# Patient Record
Sex: Male | Born: 1985 | Race: Black or African American | Hispanic: No | Marital: Single | Smoking: Former smoker
Health system: Southern US, Community
[De-identification: ages and names within clinical notes are randomized; demographics above are authoritative.]

## PROBLEM LIST (undated history)

## (undated) DIAGNOSIS — B20 Human immunodeficiency virus [HIV] disease: Secondary | ICD-10-CM

## (undated) DIAGNOSIS — K047 Periapical abscess without sinus: Secondary | ICD-10-CM

---

## 2012-01-04 ENCOUNTER — Encounter (HOSPITAL_COMMUNITY): Payer: Self-pay | Admitting: Emergency Medicine

## 2012-01-04 ENCOUNTER — Emergency Department (HOSPITAL_COMMUNITY)
Admission: EM | Admit: 2012-01-04 | Discharge: 2012-01-05 | Disposition: A | Discharge status: Home / Self Care | Payer: Self-pay | Attending: Emergency Medicine | Admitting: Emergency Medicine

## 2012-01-04 DIAGNOSIS — K0889 Other specified disorders of teeth and supporting structures: Secondary | ICD-10-CM

## 2012-01-04 DIAGNOSIS — Z87891 Personal history of nicotine dependence: Secondary | ICD-10-CM | POA: Insufficient documentation

## 2012-01-04 DIAGNOSIS — K029 Dental caries, unspecified: Secondary | ICD-10-CM

## 2012-01-04 DIAGNOSIS — K047 Periapical abscess without sinus: Secondary | ICD-10-CM

## 2012-01-04 MED ORDER — HYDROCODONE-ACETAMINOPHEN 5-325 MG PO TABS: 1.0000 | ORAL_TABLET | ORAL | Status: AC | PRN

## 2012-01-04 MED ORDER — PENICILLIN V POTASSIUM 500 MG PO TABS: 500.0000 mg | ORAL_TABLET | Freq: Three times a day (TID) | ORAL | Status: AC

## 2012-01-04 NOTE — Discharge Instructions (Signed)
You were seen and evaluated for your dental pains. It is strongly recommended that you followup with the dental specialist evaluation and treatment of your symptoms. You were given a prescription for an antibiotic to prevent infections. Please take this as instructed for the folate that time.   Dental Pain A tooth ache may be caused by cavities (tooth decay). Cavities expose the nerve of the tooth to air and hot or cold temperatures. It may come from an infection or abscess (also called a boil or furuncle) around your tooth. It is also often caused by dental caries (tooth decay). This causes the pain you are having. DIAGNOSIS  Your caregiver can diagnose this problem by exam. TREATMENT   If caused by an infection, it may be treated with medications which kill germs (antibiotics) and pain medications as prescribed by your caregiver. Take medications as directed.   Only take over-the-counter or prescription medicines for pain, discomfort, or fever as directed by your caregiver.   Whether the tooth ache today is caused by infection or dental disease, you should see your dentist as soon as possible for further care.  SEEK MEDICAL CARE IF: The exam and treatment you received today has been provided on an emergency basis only. This is not a substitute for complete medical or dental care. If your problem worsens or new problems (symptoms) appear, and you are unable to meet with your dentist, call or return to this location. SEEK IMMEDIATE MEDICAL CARE IF:   You have a fever.   You develop redness and swelling of your face, jaw, or neck.   You are unable to open your mouth.   You have severe pain uncontrolled by pain medicine.  MAKE SURE YOU:   Understand these instructions.   Will watch your condition.   Will get help right away if you are not doing well or get worse.  Document Released: 06/29/2005 Document Revised: 06/18/2011 Document Reviewed: 02/15/2008 [facility] Patient Information  2012 [place], [place].     Dental Care and Dentist Visits Dental care supports good overall health. Regular dental visits can also help you avoid dental pain, bleeding, infection, and other more serious health problems in the future. It is important to keep the mouth healthy because diseases in the teeth, gums, and other oral tissues can spread to other areas of the body. Some problems, such as diabetes, heart disease, and pre-term labor have been associated with poor oral health.  See your dentist every 6 months. If you experience emergency problems such as a toothache or broken tooth, go to the dentist right away. If you see your dentist regularly, you may catch problems early. It is easier to be treated for problems in the early stages.  WHAT TO EXPECT AT A DENTIST VISIT  Your dentist will look for many common oral health problems and recommend proper treatment. At your regular dental visit, you can expect:  Gentle cleaning of the teeth and gums. This includes scraping and polishing. This helps to remove the sticky substance around the teeth and gums (plaque). Plaque forms in the mouth shortly after eating. Over time, plaque hardens on the teeth as tartar. If tartar is not removed regularly, it can cause problems. Cleaning also helps remove stains.   Periodic X-rays. These pictures of the teeth and supporting bone will help your dentist assess the health of your teeth.   Periodic fluoride treatments. Fluoride is a natural mineral shown to help strengthen teeth. Fluoride treatmentinvolves applying a fluoride gel or varnish  to the teeth. It is most commonly done in children.   Examination of the mouth, tongue, jaws, teeth, and gums to look for any oral health problems, such as:   Cavities (dental caries). This is decay on the tooth caused by plaque, sugar, and acid in the mouth. It is best to catch a cavity when it is small.   Inflammation of the gums caused by plaque buildup (gingivitis).    Problems with the mouth or malformed or misaligned teeth.   Oral cancer or other diseases of the soft tissues or jaws.  KEEP YOUR TEETH AND GUMS HEALTHY For healthy teeth and gums, follow these general guidelines as well as your dentist's specific advice:  Have your teeth professionally cleaned at the dentist every 6 months.   Brush twice daily with a fluoride toothpaste.   Floss your teeth daily.   Ask your dentist if you need fluoride supplements, treatments, or fluoride toothpaste.   Eat a healthy diet. Reduce foods and drinks with added sugar.   Avoid smoking.  TREATMENT FOR ORAL HEALTH PROBLEMS If you have oral health problems, treatment varies depending on the conditions present in your teeth and gums.  Your caregiver will most likely recommend good oral hygiene at each visit.   For cavities, gingivitis, or other oral health disease, your caregiver will perform a procedure to treat the problem. This is typically done at a separate appointment. Sometimes your caregiver will refer you to another dental specialist for specific tooth problems or for surgery.  SEEK IMMEDIATE DENTAL CARE IF:  You have pain, bleeding, or soreness in the gum, tooth, jaw, or mouth area.   A permanent tooth becomes loose or separated from the gum socket.   You experience a blow or injury to the mouth or jaw area.  Document Released: 03/11/2011 Document Revised: 06/18/2011 Document Reviewed: 03/11/2011 [facility] Patient Information 2012 [place], [place].     RESOURCE GUIDE  Chronic Pain Problems: Contact [facility]  [phone] Patients need to be referred by their primary care doctor.  Insufficient Money for Medicine: Contact United Way:  call "211" or [facility] [phone].  No Primary Care Doctor: - Call [facility]  [phone] - can help you locate a primary care doctor that  accepts your insurance, provides certain services, etc. - Physician Referral  [phone]  Agencies that provide inexpensive medical care: - [facility]  [phone] - [facility]  [phone] - [facility]  [phone] [facility]  [phone] - Planned Parenthood  [phone] [facility]  [phone]  Medicaid-accepting [facility] Providers: - [facility]- [address]  [phone], Mon-Fri 9am-7pm, Sat 9am-1pm - [facility]- [address] [place], [address] [place]  [phone] - [facility]- [address] [place]  [phone] [facility]- [address]  [phone] - Renaye Rakers- [address] [place], [address], [phone]  Only accepts [place] patients after they have their name  applied to their card  Self Pay (no insurance) in [place]: - Sickle Cell Patients: Dr Willey Blade, [facility]  [address] [place], [phone] - [facility]- [address] [place]  [phone]       [facility] [place]- [address]       -  [facility]- see information above (Speak to [employer] if you do not have insurance)       -  [facility]- [address] [place], [phone]       -  [facility]- [address],  [phone]       -  [facility]- [address], [phone]       -  Dr Julio Sicks-  [address], [place], [phone]       -  [facility]- [address] [phone]       -  [facility]- [address], [phone], also [address] [phone]       -    [facility]- [address] [place], [phone], 1st & 3rd Saturday   every month, 10am-1pm  1) Find a Doctor and Pay Out of Pocket Although you won't have to find out who is covered by your insurance plan, it is a good idea to ask around and get recommendations. You will then need to call the  office and see if the doctor you have chosen will accept you as a new patient and what types of options they offer for patients who are self-pay. Some doctors offer discounts or will set up payment plans for their patients who do not have insurance, but you will need to ask so you aren't surprised when you get to your appointment.  2) Contact Your Local Health Department Not all health departments have doctors that can see patients for sick visits, but many do, so it is worth a call to see if yours does. If you don't know where your local health department is, you can check in your phone book. The CDC also has a tool to help you locate your state's health department, and many state websites also have listings of all of their local health departments.  3) Find a Walk-in Clinic If your illness is not likely to be very severe or complicated, you may want to try a walk in clinic. These are popping up all over the country in pharmacies, drugstores, and shopping centers. They're usually staffed by nurse practitioners or physician assistants that have been trained to treat common illnesses and complaints. They're usually fairly quick and inexpensive. However, if you have serious medical issues or chronic medical problems, these are probably not your best option  STD Testing - [facility] Department of [facility] [place], STD Clinic, [address], [place], phone [phone] or [phone].  Monday - Friday, call for an appointment. [facility] of [employer], STD Clinic, [address], [place], phone [phone] or [phone].  Monday - Friday, call for an appointment.  Abuse/Neglect: [facility] Child Abuse Hotline (938)534-5592 [facility] Child Abuse Hotline (825)751-3166 (After Hours)  Emergency Shelter:  [facility] 8104907395  Maternity Homes: - Room at the [place] [facility] [phone] - [facility]  [facility] 4162114855  MRSA Hotline #:   715-491-2105  [facility] Resources  [facility]  United Way [facility] Dept. [address]                 [address]         [address] [place] [address]  [address]  Cristobal Goldmann Phone:  [phone]                                  Phone:  [phone]                   Phone:  [phone]  [facility], [phone] - [facility] - [phone]       -     [facility] in [place], [address]                                  [phone], [facility] Child Abuse Hotline (703)082-1727 or 417-472-2172 (After Hours)   Behavioral Health Services  Substance Abuse Resources: - [facility] Drug [facility]  [phone] - [facility] [phone] - [facility] [phone] Floydene Flock [phone] - Residential & Outpatient Substance Abuse Program  234-232-1994  Psychological Services: [facility]  [phone] [facility]  ([phone] - [facility], [phone] [place]. [address], [place], ACCESS LINE: (857)417-2727 or (701)743-2202, [employer]  Dental Assistance  If unable to pay or uninsured, contact:  [facility] or [facility]. to become qualified for the adult dental clinic.  Patients with Medicaid: [facility] [phone] [address], [phone] [address], [phone]  If unable to pay, or uninsured, contact [facility] [phone]) or [facility] Department ([phone] in [place], [phone] in [facility]) to become qualified for the adult dental clinic  Other Low-Cost Community Dental Services: - [facility]- [address] [place], [place], [place], [address], [phone], Ext. [address],  2nd and 4th Thursday of the month at 6:30am.  10 clients each day by appointment, can sometimes see walk-in patients if someone does not show for an appointment. [facility]- [address] [place], [place], [address], [phone] - [facility]- [address], [place], [place], [address], [phone] - [facility]- [phone] [facility]- [phone] [facility] Department- [phone]

## 2012-01-04 NOTE — ED Provider Notes (Signed)
History     CSN: [id number]  Arrival date & time 01/04/12  4098   First MD Initiated Contact with Patient 01/04/12 2157      Chief Complaint  Patient presents with  . Dental Pain     top left k-9 and bottom R moller  . Dental Problem   HPI  History provided by the patient. Patient is a 26 year old male with no significant past medical history who presents with complaints of multiple dental pain. Patient states he's had increasing pains to his right lower molar and left upper tooth for the past 5-7 days. Patient denies any injury or significant change that cause acute pains. Pain was gradual and worsening. Patient has been trying to use BC powders but this has become ineffective. Pain is worse with pressure and eating. Patient denies any other aggravating or alleviating factors. Patient denies any associated fever, chills, sweats, swelling of the gums or under the tongue. Patient has history of similar problems with right lower molar tooth. Patient states she was having pain from his tooth one month ago. Patient has not seen a dentist for his complaints.   History reviewed. No pertinent past medical history.  History reviewed. No pertinent past surgical history.  History reviewed. No pertinent family history.  History  Substance Use Topics  . Smoking status: Former Smoker    Types: Cigarettes  . Smokeless tobacco: Not on file  . Alcohol Use:       Review of Systems  Constitutional: Negative for fever and chills.  HENT: Positive for dental problem. Negative for sore throat, facial swelling and trouble swallowing.   Gastrointestinal: Negative for nausea and vomiting.    Allergies  Review of patient's allergies indicates no known allergies.  Home Medications   Current Outpatient Rx  Name Route Sig Dispense Refill  . GOODY HEADACHE PO Oral Take 1 packet by mouth every 6 (six) hours as needed. For pain/headache      BP 130/73  Pulse 81  Temp 99.4 F (37.4 C) (Oral)   Resp 18  SpO2 100%  Physical Exam  Nursing note and vitals reviewed. Constitutional: He is oriented to person, place, and time. He appears well-developed and well-nourished. No distress.  HENT:  Head: Normocephalic and atraumatic.  Mouth/Throat: Oropharynx is clear and moist.         Multiple caries throughout. Significant decay of right lower similar to the gum line. Tenderness to percussion over the area. No swelling or fluctuance of the gums. No swelling of the tongue. Small caries to left upper first premolar. Pain with percussion. No swelling of the gums.  Neck: Normal range of motion. Neck supple.  Cardiovascular: Normal rate and regular rhythm.   Pulmonary/Chest: Effort normal and breath sounds normal.  Lymphadenopathy:    He has no cervical adenopathy.  Neurological: He is alert and oriented to person, place, and time.  Skin: Skin is warm.  Psychiatric: He has a normal mood and affect. His behavior is normal.    ED Course  Procedures     1. Pain, dental   2. Periapical abscess   3. Dental caries       MDM  11:00PM patient seen and evaluated. Patient no acute distress.        Angus Seller, [place] 01/06/12 [phone]

## 2012-01-04 NOTE — ED Notes (Signed)
Pt reports dental pain onset x 5 days BC powder now ineffective for relief

## 2012-01-05 NOTE — ED Notes (Signed)
Signature pad not available, Rx x2 given. Denies needs or questions.

## 2012-01-07 NOTE — ED Provider Notes (Signed)
Medical screening examination/treatment/procedure(s) were performed by non-physician practitioner and as supervising physician I was immediately available for consultation/collaboration.   Nat Christen, MD 01/07/12 [phone]

## 2012-02-15 ENCOUNTER — Encounter (HOSPITAL_COMMUNITY): Payer: Self-pay | Admitting: Emergency Medicine

## 2012-02-15 ENCOUNTER — Encounter (HOSPITAL_COMMUNITY): Payer: Self-pay | Admitting: *Deleted

## 2012-02-15 ENCOUNTER — Emergency Department (HOSPITAL_COMMUNITY)
Admission: EM | Admit: 2012-02-15 | Discharge: 2012-02-15 | Disposition: A | Discharge status: Home / Self Care | Payer: Self-pay | Attending: Emergency Medicine | Admitting: Emergency Medicine

## 2012-02-15 DIAGNOSIS — K047 Periapical abscess without sinus: Secondary | ICD-10-CM

## 2012-02-15 DIAGNOSIS — Z87891 Personal history of nicotine dependence: Secondary | ICD-10-CM | POA: Insufficient documentation

## 2012-02-15 HISTORY — DX: Periapical abscess without sinus: K04.7

## 2012-02-15 MED ORDER — HYDROCODONE-ACETAMINOPHEN 5-325 MG PO TABS
2.0000 | ORAL_TABLET | Freq: Once | ORAL | Status: AC
  Administered 2012-02-15: 2 via ORAL
  Filled 2012-02-15: qty 2

## 2012-02-15 MED ORDER — HYDROCODONE-ACETAMINOPHEN 5-325 MG PO TABS: 1.0000 | ORAL_TABLET | Freq: Four times a day (QID) | ORAL | Status: AC | PRN

## 2012-02-15 MED ORDER — PENICILLIN V POTASSIUM 500 MG PO TABS: 500.0000 mg | ORAL_TABLET | Freq: Four times a day (QID) | ORAL | Status: AC

## 2012-02-15 NOTE — ED Notes (Signed)
Pt was seen here this am and tx for a dental abscess to R lower molar.  He was prescribed pcn which he has been taking, however ,the swelling is increasing.  Pain is being controlled somewhat by norco.

## 2012-02-15 NOTE — ED Provider Notes (Signed)
Medical screening examination/treatment/procedure(s) were performed by non-physician practitioner and as supervising physician I was immediately available for consultation/collaboration.    Nelia Shi, MD 02/15/12 [phone]

## 2012-02-15 NOTE — ED Notes (Signed)
Pt c/o right lower dental pain x 2 days.

## 2012-02-15 NOTE — ED Provider Notes (Signed)
History     CSN: [id number]  Arrival date & time 02/15/12  4098   First MD Initiated Contact with Patient 02/15/12 [phone]      Chief Complaint  Patient presents with  . Dental Pain    (Consider location/radiation/quality/duration/timing/severity/associated sxs/prior treatment) HPI Comments: Victor [name] 26 y.o. male   The chief complaint is: Patient presents with:   Dental Pain   The patient has medical history significant for:   History reviewed. No pertinent past medical history.  Patient presents with lower right molar pain that began Saturday morning. Patient states that he tried B/C powder, Advil, Tylenol, and Orajel without relief. He rates the pain 10/10 with some facial swelling. Denies fever, chills, night sweats. Denies trismus, or dysphagia. Patient was seen for a similar presentation on 01/04/12 and discharged on pain medication and antibiotics. However he never filled the prescriptions or saw a dentist. He states this is due to lack of insurance and finances.      Patient is a 26 y.o. male presenting with tooth pain. The history is provided by the patient.  Dental PainPrimary symptoms do not include fever, shortness of breath or sore throat.  Additional symptoms include: facial swelling. Additional symptoms do not include: trouble swallowing.    History reviewed. No pertinent past medical history.  History reviewed. No pertinent past surgical history.  History reviewed. No pertinent family history.  History  Substance Use Topics  . Smoking status: Former Smoker    Types: Cigarettes  . Smokeless tobacco: Not on file  . Alcohol Use: Yes      Review of Systems  Constitutional: Negative for fever, chills and activity change.  HENT: Positive for facial swelling and dental problem. Negative for sore throat and trouble swallowing.   Eyes: Positive for discharge.  Respiratory: Negative for shortness of breath.   Gastrointestinal: Negative for nausea,  vomiting, diarrhea and anal bleeding.    Allergies  Review of patient's allergies indicates no known allergies.  Home Medications   Current Outpatient Rx  Name Route Sig Dispense Refill  . ACETAMINOPHEN 325 MG PO TABS Oral Take 975 mg by mouth every 6 (six) hours as needed. For pain    . GOODY HEADACHE PO Oral Take 1 packet by mouth every 6 (six) hours as needed. For pain/headache    . IBUPROFEN 200 MG PO TABS Oral Take 600 mg by mouth every 6 (six) hours as needed. For pain      BP 150/88  Pulse 61  Resp 18  SpO2 100%  Physical Exam  Nursing note and vitals reviewed. Constitutional: He appears well-developed and well-nourished.  HENT:  Head: Atraumatic. No trismus in the jaw.  Mouth/Throat: Oropharynx is clear and moist. Abnormal dentition. Dental abscesses and dental caries present. No oropharyngeal exudate, posterior oropharyngeal edema or posterior oropharyngeal erythema.    Eyes: No scleral icterus.  Neck: Normal range of motion. Neck supple.  Cardiovascular: Normal rate, regular rhythm and normal heart sounds.   Pulmonary/Chest: Effort normal and breath sounds normal.  Abdominal: Soft. Bowel sounds are normal. There is no tenderness.  Lymphadenopathy:    He has no cervical adenopathy.  Neurological: He is alert.  Skin: Skin is warm and dry.    ED Course  Procedures (including critical care time)  Labs Reviewed - No data to display No results found.   1. Dental abscess       MDM  Patient presents with dental pain of his right lower molar, rated 10/10 with some  associated facial swelling. Patient presented with the same complaint 01/04/12 and did not follow discharge instructions of taking ABX and pain medication or seeing a dentist. Patient given two Norco in ED. Patient reassessed and pain has improved and feel ready for discharge. Patient has no red flags for Ludwig's angina. Patient will be discharged on ABX and pain medication with referral to dentist on  call. Patient given return precautions verbally and in discharge instructions.        Pixie Casino, PA-C 02/15/12 1109

## 2012-02-15 NOTE — ED Notes (Signed)
Pt updated

## 2012-02-16 ENCOUNTER — Emergency Department (HOSPITAL_COMMUNITY)
Admission: EM | Admit: 2012-02-16 | Discharge: 2012-02-16 | Disposition: A | Discharge status: Home / Self Care | Payer: Self-pay | Attending: Emergency Medicine | Admitting: Emergency Medicine

## 2012-02-16 DIAGNOSIS — K047 Periapical abscess without sinus: Secondary | ICD-10-CM

## 2012-02-16 HISTORY — DX: Periapical abscess without sinus: K04.7

## 2012-02-16 MED ORDER — OXYCODONE-ACETAMINOPHEN 5-325 MG PO TABS
2.0000 | ORAL_TABLET | Freq: Once | ORAL | Status: AC
  Administered 2012-02-16: 2 via ORAL
  Filled 2012-02-16: qty 2

## 2012-02-16 MED ORDER — IBUPROFEN 800 MG PO TABS: 800.0000 mg | ORAL_TABLET | Freq: Three times a day (TID) | ORAL | Status: AC

## 2012-02-16 NOTE — ED Provider Notes (Signed)
History     CSN: [id number]  Arrival date & time 02/15/12  2234   First MD Initiated Contact with Patient 02/16/12 0010      Chief Complaint  Patient presents with  . Oral Swelling    dental abscess     (Consider location/radiation/quality/duration/timing/severity/associated sxs/prior treatment) HPI History provided by patient. Seen here earlier today for dental pain. Was given a prescription for penicillin and hydrocodone which she has started. Pain worsening today and has now developed associated swelling. No fever or chills. No nausea vomiting. Pain medications not helping. Pain is sharp in quality and not radiating from right lower jaw. No trouble swallowing. No troubles breathing. No known alleviating factors. Worse with chewing. Past Medical History  Diagnosis Date  . Dental abscess 02/15/2012    History reviewed. No pertinent past surgical history.  No family history on file.  History  Substance Use Topics  . Smoking status: Former Smoker    Types: Cigarettes  . Smokeless tobacco: Not on file  . Alcohol Use: Yes      Review of Systems  Constitutional: Negative for fever and chills.  HENT: Positive for dental problem. Negative for neck pain and neck stiffness.   Eyes: Negative for pain.  Respiratory: Negative for shortness of breath.   Cardiovascular: Negative for chest pain.  Gastrointestinal: Negative for abdominal pain.  Genitourinary: Negative for dysuria.  Musculoskeletal: Negative for back pain.  Skin: Negative for rash.  Neurological: Negative for headaches.  All other systems reviewed and are negative.    Allergies  Review of patient's allergies indicates no known allergies.  Home Medications   Current Outpatient Rx  Name Route Sig Dispense Refill  . ACETAMINOPHEN 325 MG PO TABS Oral Take 975 mg by mouth every 6 (six) hours as needed. For pain    . GOODY HEADACHE PO Oral Take 1 packet by mouth every 6 (six) hours as needed. For pain/headache      . HYDROCODONE-ACETAMINOPHEN 5-325 MG PO TABS Oral Take 1 tablet by mouth every 6 (six) hours as needed for pain. 12 tablet 0  . IBUPROFEN 200 MG PO TABS Oral Take 600 mg by mouth every 6 (six) hours as needed. For pain    . PENICILLIN V POTASSIUM 500 MG PO TABS Oral Take 1 tablet (500 mg total) by mouth 4 (four) times daily. 40 tablet 0    BP 144/78  Pulse 86  Temp 99 F (37.2 C) (Oral)  Resp 20  SpO2 100%  Physical Exam  Constitutional: He is oriented to person, place, and time. He appears well-developed and well-nourished.  HENT:  Head: Normocephalic and atraumatic.       Right lower first molar tenderness with associated gingival swelling and facial swelling. No erythema. Moderate trismus. Uvula midline.  Eyes: Conjunctivae and EOM are normal. Pupils are equal, round, and reactive to light.  Neck: Trachea normal. Neck supple. No thyromegaly present.  Cardiovascular: Normal rate, regular rhythm, S1 normal, S2 normal and normal pulses.     No systolic murmur is present   No diastolic murmur is present  Pulses:      Radial pulses are 2+ on the right side, and 2+ on the left side.  Pulmonary/Chest: Effort normal and breath sounds normal. He has no wheezes. He has no rhonchi. He has no rales. He exhibits no tenderness.  Abdominal: Soft. Normal appearance and bowel sounds are normal. There is no tenderness. There is no CVA tenderness and negative Murphy's sign.  Musculoskeletal:  BLE:s Calves nontender, no cords or erythema, negative Homans sign  Neurological: He is alert and oriented to person, place, and time. He has normal strength. No cranial nerve deficit or sensory deficit. GCS eye subscore is 4. GCS verbal subscore is 5. GCS motor subscore is 6.  Skin: Skin is warm and dry. No rash noted. He is not diaphoretic.  Psychiatric: His speech is normal.       Cooperative and appropriate    ED Course  INCISION AND DRAINAGE Date/Time: 02/16/2012 2:05 AM Performed by: Sunnie Nielsen Authorized by: Sunnie Nielsen Consent: Verbal consent obtained. Risks and benefits: risks, benefits and alternatives were discussed Consent given by: patient Patient understanding: patient states understanding of the procedure being performed Patient consent: the patient's understanding of the procedure matches consent given Procedure consent: procedure consent matches procedure scheduled Required items: required blood products, implants, devices, and special equipment available Patient identity confirmed: verbally with patient Time out: Immediately prior to procedure a "time out" was called to verify the correct patient, procedure, equipment, support staff and site/side marked as required. Type: abscess Location: Right lower molar dental abscess. Anesthesia: local infiltration Local anesthetic: bupivacaine 0.5% without epinephrine Anesthetic total: 1.8 ml Patient sedated: no Risk factor: underlying major vessel Scalpel size: 11 Needle gauge: 22 Incision type: single straight Complexity: complex Drainage: purulent Drainage amount: moderate Wound treatment: wound left open Patient tolerance: Patient tolerated the procedure well with no immediate complications.   (including critical care time)  Percocet provided prior to procedure.  I&D as above with discharge and drainage.  Plan discharge home with dental referral. Plan continue hydrocodone and penicillin as prescribed. Will add ibuprofen 800 mg. We'll for discharge home to [facility]   old records reviewed. Nurse's notes reviewed. Vital signs reviewed. Percocet provided. I&D as above.        Sunnie Nielsen, MD 02/16/12 [phone]

## 2012-12-07 ENCOUNTER — Emergency Department (HOSPITAL_COMMUNITY)
Admission: EM | Admit: 2012-12-07 | Discharge: 2012-12-07 | Disposition: A | Discharge status: Home / Self Care | Payer: Self-pay | Attending: Emergency Medicine | Admitting: Emergency Medicine

## 2012-12-07 ENCOUNTER — Encounter (HOSPITAL_COMMUNITY): Payer: Self-pay | Admitting: Adult Health

## 2012-12-07 DIAGNOSIS — Z79899 Other long term (current) drug therapy: Secondary | ICD-10-CM | POA: Insufficient documentation

## 2012-12-07 DIAGNOSIS — E876 Hypokalemia: Secondary | ICD-10-CM | POA: Insufficient documentation

## 2012-12-07 DIAGNOSIS — R748 Abnormal levels of other serum enzymes: Secondary | ICD-10-CM | POA: Insufficient documentation

## 2012-12-07 DIAGNOSIS — R7989 Other specified abnormal findings of blood chemistry: Secondary | ICD-10-CM

## 2012-12-07 DIAGNOSIS — R112 Nausea with vomiting, unspecified: Secondary | ICD-10-CM | POA: Insufficient documentation

## 2012-12-07 DIAGNOSIS — Z7982 Long term (current) use of aspirin: Secondary | ICD-10-CM | POA: Insufficient documentation

## 2012-12-07 LAB — CBC WITH DIFFERENTIAL/PLATELET
Eosinophils Relative: 0 % (ref 0–5)
HCT: 45.9 % (ref 39.0–52.0)
Hemoglobin: 15.8 g/dL (ref 13.0–17.0)
Lymphocytes Relative: 33 % (ref 12–46)
MCV: 86.9 fL (ref 78.0–100.0)
Monocytes Absolute: 0.6 10*3/uL (ref 0.1–1.0)
Monocytes Relative: 18 % — ABNORMAL HIGH (ref 3–12)
Neutro Abs: 1.7 10*3/uL (ref 1.7–7.7)
WBC: 3.6 10*3/uL — ABNORMAL LOW (ref 4.0–10.5)

## 2012-12-07 LAB — COMPREHENSIVE METABOLIC PANEL
BUN: 9 mg/dL (ref 6–23)
CO2: 25 mEq/L (ref 19–32)
Chloride: 100 mEq/L (ref 96–112)
Creatinine, Ser: 1.43 mg/dL — ABNORMAL HIGH (ref 0.50–1.35)
GFR calc Af Amer: 77 mL/min — ABNORMAL LOW (ref >90)
GFR calc non Af Amer: 66 mL/min — ABNORMAL LOW (ref >90)
Glucose, Bld: 116 mg/dL — ABNORMAL HIGH (ref 70–99)
Total Bilirubin: 0.5 mg/dL (ref 0.3–1.2)

## 2012-12-07 MED ORDER — POTASSIUM CHLORIDE 10 MEQ/100ML IV SOLN
10.0000 meq | Freq: Once | INTRAVENOUS | Status: AC
  Administered 2012-12-07: 10 meq via INTRAVENOUS
  Filled 2012-12-07: qty 100

## 2012-12-07 MED ORDER — ONDANSETRON 4 MG PO TBDP
8.0000 mg | ORAL_TABLET | Freq: Once | ORAL | Status: AC
  Administered 2012-12-07: 8 mg via ORAL

## 2012-12-07 MED ORDER — ONDANSETRON HCL 4 MG PO TABS: 4.0000 mg | ORAL_TABLET | Freq: Four times a day (QID) | ORAL | Status: DC

## 2012-12-07 MED ORDER — SODIUM CHLORIDE 0.9 % IV BOLUS (SEPSIS)
1000.0000 mL | Freq: Once | INTRAVENOUS | Status: AC
  Administered 2012-12-07: 1000 mL via INTRAVENOUS

## 2012-12-07 MED ORDER — ONDANSETRON 4 MG PO TBDP
ORAL_TABLET | ORAL | Status: AC
  Filled 2012-12-07: qty 2

## 2012-12-07 NOTE — ED Provider Notes (Signed)
History     CSN: [id number]  Arrival date & time 12/07/12  0007   First MD Initiated Contact with Patient 12/07/12 0154      Chief Complaint  Patient presents with  . Emesis    (Consider location/radiation/quality/duration/timing/severity/associated sxs/prior treatment) HPI Hx per PT  - N/V since 6pm last night with generalized weakness, no diarrhea, some mild intermittent ABD cramping. Unable to hold anything down. No F/C, No recent travel, no rash, no known sick contacts  Past Medical History  Diagnosis Date  . Dental abscess 02/15/2012    History reviewed. No pertinent past surgical history.  History reviewed. No pertinent family history.  History  Substance Use Topics  . Smoking status: Former Smoker    Types: Cigarettes  . Smokeless tobacco: Not on file  . Alcohol Use: Yes      Review of Systems  Constitutional: Negative for fever and chills.  HENT: Negative for neck pain and neck stiffness.   Eyes: Negative for pain.  Respiratory: Negative for shortness of breath.   Cardiovascular: Negative for chest pain.  Gastrointestinal: Positive for nausea and vomiting. Negative for blood in stool and abdominal distention.  Genitourinary: Negative for dysuria.  Musculoskeletal: Negative for back pain.  Skin: Negative for rash.  Neurological: Negative for headaches.  All other systems reviewed and are negative.    Allergies  Review of patient's allergies indicates no known allergies.  Home Medications   Current Outpatient Rx  Name  Route  Sig  Dispense  Refill  . acetaminophen (TYLENOL) 325 MG tablet   Oral   Take 975 mg by mouth every 6 (six) hours as needed. For pain         . Aspirin-Acetaminophen-Caffeine (GOODY HEADACHE PO)   Oral   Take 1 packet by mouth every 6 (six) hours as needed. For pain/headache         . ibuprofen (ADVIL,MOTRIN) 200 MG tablet   Oral   Take 600 mg by mouth every 6 (six) hours as needed. For pain         . ondansetron  (ZOFRAN) 4 MG tablet   Oral   Take 1 tablet (4 mg total) by mouth every 6 (six) hours.   12 tablet   0     BP 132/71  Pulse 80  Temp(Src) 99 F (37.2 C) (Oral)  Resp 18  SpO2 98%  Physical Exam  Constitutional: He is oriented to person, place, and time. He appears well-developed and well-nourished.  HENT:  Head: Normocephalic and atraumatic.  Eyes: EOM are normal. Pupils are equal, round, and reactive to light.  Neck: Neck supple.  Cardiovascular: Regular rhythm and intact distal pulses.   Pulmonary/Chest: Effort normal and breath sounds normal. No respiratory distress.  Abdominal: Soft. He exhibits no distension. There is no tenderness. There is no rebound and no guarding.  Hyperactive bowel sounds  Musculoskeletal: Normal range of motion. He exhibits no edema.  Neurological: He is alert and oriented to person, place, and time.  Skin: Skin is warm and dry.    ED Course  Procedures (including critical care time)  Labs Reviewed  CBC WITH DIFFERENTIAL - Abnormal; Notable for the following:    WBC 3.6 (*)    Monocytes Relative 18 (*)    All other components within normal limits  COMPREHENSIVE METABOLIC PANEL - Abnormal; Notable for the following:    Potassium 3.2 (*)    Glucose, Bld 116 (*)    Creatinine, Ser 1.43 (*)  GFR calc non Af Amer 66 (*)    GFR calc Af Amer 77 (*)    All other components within normal limits   No results found.   1. Elevated serum creatinine   2. Hypokalemia   3. Nausea & vomiting    IVFs, zofran and potassium provided  Recheck after medications - feeling a lot better, tolerating POs, wants to go home.  Plan RX zofran, outpatient follow up as needed. Strict return precautions verbalized as understood.    MDM  N/V with ABD cramping resolved  Serial ABD exams benign  Labs  IVFs, potassium  VS and nursing notes reviewed        Sunnie Nielsen, MD 12/08/12 [phone]

## 2012-12-07 NOTE — ED Notes (Signed)
Pt reports nausea, vomiting and feeling weak that began today associated with weakness and feeling sleepy. Pt reports generalized abdominal pain. Reports vomiting 5 times today since 6 pm. Denies diarrhea

## 2013-03-11 ENCOUNTER — Encounter (HOSPITAL_COMMUNITY): Payer: Self-pay

## 2013-03-11 ENCOUNTER — Emergency Department (HOSPITAL_COMMUNITY)
Admission: EM | Admit: 2013-03-11 | Discharge: 2013-03-11 | Disposition: A | Discharge status: Home / Self Care | Payer: Self-pay | Attending: Emergency Medicine | Admitting: Emergency Medicine

## 2013-03-11 DIAGNOSIS — T7840XA Allergy, unspecified, initial encounter: Secondary | ICD-10-CM

## 2013-03-11 DIAGNOSIS — R21 Rash and other nonspecific skin eruption: Secondary | ICD-10-CM | POA: Insufficient documentation

## 2013-03-11 DIAGNOSIS — L259 Unspecified contact dermatitis, unspecified cause: Secondary | ICD-10-CM | POA: Insufficient documentation

## 2013-03-11 DIAGNOSIS — Z87891 Personal history of nicotine dependence: Secondary | ICD-10-CM | POA: Insufficient documentation

## 2013-03-11 DIAGNOSIS — L309 Dermatitis, unspecified: Secondary | ICD-10-CM

## 2013-03-11 DIAGNOSIS — Z8719 Personal history of other diseases of the digestive system: Secondary | ICD-10-CM | POA: Insufficient documentation

## 2013-03-11 MED ORDER — PREDNISONE 20 MG PO TABS: ORAL_TABLET | ORAL | Status: DC

## 2013-03-11 MED ORDER — DIPHENHYDRAMINE HCL 25 MG PO TABS: 25.0000 mg | ORAL_TABLET | Freq: Four times a day (QID) | ORAL | Status: DC

## 2013-03-11 NOTE — ED Provider Notes (Signed)
CSN: [id number]     Arrival date & time 03/11/13  4098 History   First MD Initiated Contact with Patient 03/11/13 0900     Chief Complaint  Patient presents with  . Allergic Reaction   (Consider location/radiation/quality/duration/timing/severity/associated sxs/prior Treatment) HPI Comments: Patient presents with complaint of a lump that developed during the overnight hours above his right ear on his scalp. Patient states that he has had recent urticaria and sensation of throat swelling over the past week however none of this currently. He denies fever. No nausea or vomiting. No treatments prior to arrival. Patient has a history of eczema which is less well controlled. He has not been using Aveeno lotion which typically helps. He does have recent new skin exposures. Onset of symptoms gradual. Course is constant. Nothing makes symptoms better or worse  The history is provided by the patient.    Past Medical History  Diagnosis Date  . Dental abscess 02/15/2012   History reviewed. No pertinent past surgical history. No family history on file. History  Substance Use Topics  . Smoking status: Former Smoker    Types: Cigarettes  . Smokeless tobacco: Not on file  . Alcohol Use: Yes    Review of Systems  Constitutional: Negative for fever.  HENT: Negative for facial swelling and trouble swallowing.   Eyes: Negative for redness.  Respiratory: Negative for shortness of breath, wheezing and stridor.   Cardiovascular: Negative for chest pain.  Gastrointestinal: Negative for nausea and vomiting.  Musculoskeletal: Negative for myalgias.  Skin: Positive for rash.  Neurological: Negative for light-headedness.  Psychiatric/Behavioral: Negative for confusion.    Allergies  Shrimp  Home Medications   Current Outpatient Rx  Name  Route  Sig  Dispense  Refill  . diphenhydrAMINE (BENADRYL) 25 MG tablet   Oral   Take 1 tablet (25 mg total) by mouth every 6 (six) hours.   20 tablet   0    . predniSONE (DELTASONE) 20 MG tablet      3 Tabs PO Days 1-3, then 2 tabs PO Days 4-6, then 1 tab PO Day 7-9, then Half Tab PO Day 10-12   20 tablet   0    BP 107/60  Pulse 74  Temp(Src) 98.3 F (36.8 C) (Oral)  Resp 20  SpO2 100% Physical Exam  Nursing note and vitals reviewed. Constitutional: He appears well-developed and well-nourished.  HENT:  Head: Normocephalic and atraumatic.  3cm diameter circular swelling of scalp superior to R ear. No associated skin trauma. Induration but no fluctuance. No overlying erythema or warmth.   Eyes: Conjunctivae are normal.  Neck: Normal range of motion. Neck supple.  Pulmonary/Chest: No respiratory distress.  Neurological: He is alert.  Skin: Skin is warm and dry.  Psychiatric: He has a normal mood and affect.    ED Course  Procedures (including critical care time) Labs Review Labs Reviewed - No data to display Imaging Review No results found.  10:35 AM Patient seen and examined. Work-up initiated.   Vital signs reviewed and are as follows: Filed Vitals:   03/11/13 1047  BP: 107/60  Pulse: 74  Temp: 98.3 F (36.8 C)  Resp: 20   Pt encouraged to use prednisone and benadryl.   Patient urged to return with worsening symptoms or other concerns. Patient verbalized understanding and agrees with plan.   The patient was urged to return to the Emergency Department urgently with worsening pain, swelling, expanding erythema especially if it streaks away from the affected area,  fever, or if they have any other concerns.   The patient was urged to return to the Emergency Department or go to their PCP in 48 hours for wound recheck if the area is not significantly improved.  The patient verbalized understanding and stated agreement with this plan.     MDM   1. Allergic reaction, initial encounter   2. Eczema    Scalp lesion: Sebaceous cyst versus lymph node versus early abscess. It does not appear infectious at this time. No  fluctuance to drain. Pt to monitor. He is informed that he may need to return if the area continues to worsen.  Eczema: Prednisone for temporary control of symptoms.     Renne Crigler, PA-C 03/11/13 1115

## 2013-03-11 NOTE — ED Notes (Signed)
Pt states that a week ago his throat felt swollen.  Then pt states on Thursday he "felt like he was getting hives."  Now pt presents with c/o knot to the right side of head just above the ear.  Pt states last time he had that happen he was bitten by a spider.

## 2013-03-11 NOTE — Progress Notes (Signed)
Met patient at bedside.Role of case manager explained.Patient reports understanding.Patient provided with resource sheet for the [facility]/ urgent care information. Patient provided with Education on [employer] program and [email or web address].Patient reports he understands education provided today.No further case manager needs identified at this time.

## 2013-03-12 NOTE — ED Provider Notes (Signed)
Medical screening examination/treatment/procedure(s) were performed by non-physician practitioner and as supervising physician I was immediately available for consultation/collaboration.   Claudean Kinds, MD 03/12/13 [phone]

## 2013-03-14 ENCOUNTER — Telehealth: Payer: Self-pay | Admitting: General Practice

## 2013-04-06 NOTE — Telephone Encounter (Signed)
Missing or Invalid Number- Also tried [phone], recording says not accepting calls.

## 2013-07-10 ENCOUNTER — Encounter (HOSPITAL_COMMUNITY): Payer: Self-pay | Admitting: Emergency Medicine

## 2013-07-10 ENCOUNTER — Emergency Department (HOSPITAL_COMMUNITY)
Admission: EM | Admit: 2013-07-10 | Discharge: 2013-07-10 | Disposition: A | Discharge status: Home / Self Care | Payer: Self-pay | Attending: Emergency Medicine | Admitting: Emergency Medicine

## 2013-07-10 DIAGNOSIS — Z87891 Personal history of nicotine dependence: Secondary | ICD-10-CM | POA: Insufficient documentation

## 2013-07-10 DIAGNOSIS — K0889 Other specified disorders of teeth and supporting structures: Secondary | ICD-10-CM

## 2013-07-10 DIAGNOSIS — R630 Anorexia: Secondary | ICD-10-CM | POA: Insufficient documentation

## 2013-07-10 DIAGNOSIS — K044 Acute apical periodontitis of pulpal origin: Secondary | ICD-10-CM | POA: Insufficient documentation

## 2013-07-10 DIAGNOSIS — K089 Disorder of teeth and supporting structures, unspecified: Secondary | ICD-10-CM | POA: Insufficient documentation

## 2013-07-10 DIAGNOSIS — K047 Periapical abscess without sinus: Secondary | ICD-10-CM

## 2013-07-10 MED ORDER — OXYCODONE-ACETAMINOPHEN 5-325 MG PO TABS
2.0000 | ORAL_TABLET | Freq: Once | ORAL | Status: AC
  Administered 2013-07-10: 2 via ORAL
  Filled 2013-07-10: qty 2

## 2013-07-10 MED ORDER — AMOXICILLIN 500 MG PO CAPS: 500.0000 mg | ORAL_CAPSULE | Freq: Three times a day (TID) | ORAL | Status: DC

## 2013-07-10 MED ORDER — HYDROCODONE-ACETAMINOPHEN 5-325 MG PO TABS: 1.0000 | ORAL_TABLET | ORAL | Status: DC | PRN

## 2013-07-10 NOTE — ED Provider Notes (Signed)
CSN: [id number]     Arrival date & time 07/10/13  1131 History   First MD Initiated Contact with Patient 07/10/13 1202    This chart was scribed for Clinton Sawyer, a non-physician practitioner working with Leonette Most B. Bernette Mayers, MD by Lewanda Rife, ED Scribe. This patient was seen in room TR11C/TR11C and the patient's care was started at 12:40 PM     Chief Complaint  Patient presents with  . Dental Problem   (Consider location/radiation/quality/duration/timing/severity/associated sxs/prior Treatment) The history is provided by the patient. No language interpreter was used.   HPI Comments: Victor [name] is a 27 y.o. male who presents to the Emergency Department with PMHx of dental abscess complaining of constant severe right sided dental pain onset 5 days. Describes pain as throbbing. Reports associated right sided facial swelling. Reports associated anorexia secondary to pain. Reports pain is exacerbated by eating and drinking fluids. Denies associated dysphagia, sore throat, fever, and chills.  Past Medical History  Diagnosis Date  . Dental abscess 02/15/2012   No past surgical history on file. No family history on file. History  Substance Use Topics  . Smoking status: Former Smoker    Types: Cigarettes  . Smokeless tobacco: Not on file  . Alcohol Use: Yes    Review of Systems  Constitutional: Negative for fever.  HENT: Positive for dental problem.    A complete 10 system review of systems was obtained and all systems are negative except as noted in the HPI and PMHx.    Allergies  Shrimp  Home Medications   Current Outpatient Rx  Name  Route  Sig  Dispense  Refill  . amoxicillin (AMOXIL) 500 MG capsule   Oral   Take 1 capsule (500 mg total) by mouth 3 (three) times daily.   21 capsule   0   . diphenhydrAMINE (BENADRYL) 25 MG tablet   Oral   Take 1 tablet (25 mg total) by mouth every 6 (six) hours.   20 tablet   0   . HYDROcodone-acetaminophen  (NORCO/VICODIN) 5-325 MG per tablet   Oral   Take 1-2 tablets by mouth every 4 (four) hours as needed.   10 tablet   0   . predniSONE (DELTASONE) 20 MG tablet      3 Tabs PO Days 1-3, then 2 tabs PO Days 4-6, then 1 tab PO Day 7-9, then Half Tab PO Day 10-12   20 tablet   0    BP 137/88  Pulse 92  Temp(Src) 98.3 F (36.8 C) (Oral)  Resp 16  Wt 210 lb 8 oz (95.482 kg)  SpO2 100% Physical Exam  Nursing note and vitals reviewed. Constitutional: He is oriented to person, place, and time. He appears well-developed and well-nourished. No distress.  HENT:  Head: Normocephalic and atraumatic.  Mouth/Throat: Uvula is midline, oropharynx is clear and moist and mucous membranes are normal. Mucous membranes are not dry. No trismus in the jaw. Abnormal dentition. No dental abscesses. No oropharyngeal exudate, posterior oropharyngeal edema, posterior oropharyngeal erythema or tonsillar abscesses.    Mild right sided facial swelling  Poor dentition    Eyes: EOM are normal.  Neck: Normal range of motion. Neck supple. No tracheal deviation present.  Cardiovascular: Normal rate.   Pulmonary/Chest: Effort normal. No respiratory distress.  Musculoskeletal: Normal range of motion.  Lymphadenopathy:    He has no cervical adenopathy.  Neurological: He is alert and oriented to person, place, and time.  Skin: Skin is warm and  dry.  Psychiatric: He has a normal mood and affect. His behavior is normal.    ED Course  Procedures  COORDINATION OF CARE:  Nursing notes reviewed. Vital signs reviewed. Initial pt interview and examination performed.   12:40 PM-Discussed work up plan with pt at bedside. Pt agrees with plan.   Treatment plan initiated:Medications - No data to display   Initial diagnostic testing ordered.    Labs Review Labs Reviewed - No data to display Imaging Review No results found.  EKG Interpretation   None       MDM   1. Pain, dental   2. Dental infection      Dental pain associated with dental infection. No evidence of dental abscess. Patient is afebrile, non toxic appearing and swallowing secretions well. I gave patient referral to dentist and stressed the importance of dental follow up for ultimate management of dental pain. I will also give amoxicillin and pain control. Patient voices understanding and is agreeable to plan.   I personally performed the services described in this documentation, which was scribed in my presence. The recorded information has been reviewed and is accurate.    Trevor Mace, PA-C 07/10/13 1241

## 2013-07-10 NOTE — ED Notes (Signed)
Pt. Stated, I have an abscess on the rt. Side of my mouth and Im unable to taste anything.

## 2013-07-10 NOTE — ED Provider Notes (Signed)
Medical screening examination/treatment/procedure(s) were performed by non-physician practitioner and as supervising physician I was immediately available for consultation/collaboration.  EKG Interpretation   None         Charles B. Sheldon, MD 07/10/13 1527 

## 2014-09-03 ENCOUNTER — Emergency Department (HOSPITAL_COMMUNITY)
Admission: EM | Admit: 2014-09-03 | Discharge: 2014-09-03 | Disposition: A | Discharge status: Home / Self Care | Payer: Self-pay | Attending: Emergency Medicine | Admitting: Emergency Medicine

## 2014-09-03 ENCOUNTER — Encounter (HOSPITAL_COMMUNITY): Payer: Self-pay | Admitting: Emergency Medicine

## 2014-09-03 DIAGNOSIS — Z87891 Personal history of nicotine dependence: Secondary | ICD-10-CM | POA: Insufficient documentation

## 2014-09-03 DIAGNOSIS — Z8719 Personal history of other diseases of the digestive system: Secondary | ICD-10-CM | POA: Insufficient documentation

## 2014-09-03 DIAGNOSIS — H6122 Impacted cerumen, left ear: Secondary | ICD-10-CM | POA: Insufficient documentation

## 2014-09-03 NOTE — Discharge Instructions (Signed)
Cerumen Impaction °A cerumen impaction is when the wax in your ear forms a plug. This plug usually causes reduced hearing. Sometimes it also causes an earache or dizziness. Removing a cerumen impaction can be difficult and painful. The wax sticks to the ear canal. The canal is sensitive and bleeds easily. If you try to remove a heavy wax buildup with a cotton tipped swab, you may push it in further. °Irrigation with water, suction, and small ear curettes may be used to clear out the wax. If the impaction is fixed to the skin in the ear canal, ear drops may be needed for a few days to loosen the wax. People who build up a lot of wax frequently can use ear wax removal products available in your local drugstore. °SEEK MEDICAL CARE IF:  °You develop an earache, increased hearing loss, or marked dizziness. °Document Released: 08/06/2004 Document Revised: 09/21/2011 Document Reviewed: 09/26/2009 °Exit[employer]® Patient Information ©2015 [employer]. This information is not intended to replace advice given to you by your health care provider. Make sure you discuss any questions you have with your health care provider. ° °

## 2014-09-03 NOTE — ED Provider Notes (Signed)
CSN: [id number]     Arrival date & time 09/03/14  0508 History   First MD Initiated Contact with Patient 09/03/14 [phone]     Chief Complaint  Patient presents with  . Hearing Problem    The history is provided by the patient.  Patient presents with left ear hearing loss/ringing for past week He reports it is worsening Nothing improves his symptoms He reports a week ago he noticed fullness and hearing loss in left ear No drainage/bleeding No ear pain He reports tonight he noted left ear ringing No HA/visual changes No dizziness No ear trauma is reproted   Past Medical History  Diagnosis Date  . Dental abscess 02/15/2012   History reviewed. No pertinent past surgical history. No family history on file. History  Substance Use Topics  . Smoking status: Former Smoker    Types: Cigarettes  . Smokeless tobacco: Not on file  . Alcohol Use: Yes    Review of Systems  Constitutional: Negative for fever.  HENT: Positive for hearing loss.   Eyes: Negative for visual disturbance.  Cardiovascular: Negative for chest pain.  Neurological: Negative for dizziness, weakness and headaches.      Allergies  Shrimp  Home Medications   Prior to Admission medications   Not on File   BP 137/86 mmHg  Pulse 88  Temp(Src) 99.1 F (37.3 C) (Oral)  Resp 14  Ht 6\' 2"  (1.88 m)  Wt 215 lb (97.523 kg)  BMI 27.59 kg/m2  SpO2 99% Physical Exam CONSTITUTIONAL: Well developed/well nourished HEAD: Normocephalic/atraumatic EYES: EOMI/PERRL ENMT: Mucous membranes moist, bilateral cerumen impaction, left >right.  No mastoid tenderness.  Ears symmetric.   NECK: supple no meningeal signs SPINE/BACK:entire spine nontender CV: S1/S2 noted, no murmurs/rubs/gallops noted LUNGS: Lungs are clear to auscultation bilaterally, no apparent distress ABDOMEN: soft, nontender, no rebound or guarding, bowel sounds noted throughout abdomen NEURO: Pt is awake/alert/appropriate, moves all extremitiesx4.  No  facial droop.   EXTREMITIES: pulses normal/equal, full ROM SKIN: warm, color normal PSYCH: no abnormalities of mood noted, alert and oriented to situation  ED Course  Procedures  Nurse was able to irrigate some of the cerumen from left ear No active bleeding or signs of trauma  Pt reports feeling some improvement in his hearing  MDM   Final diagnoses:  Cerumen impaction, left    Nursing notes including past medical history and social history reviewed and considered in documentation     Sharyon Cable, MD 09/03/14 [phone]

## 2014-09-03 NOTE — ED Notes (Signed)
Pt arrives with c/o L ear ringing for about a week, states its been on and off. Tried ear drops, not effective. States able to hear a little out of left ear, but ringing

## 2015-01-11 ENCOUNTER — Encounter (HOSPITAL_COMMUNITY): Payer: Self-pay | Admitting: Emergency Medicine

## 2015-01-11 ENCOUNTER — Emergency Department (HOSPITAL_COMMUNITY)
Admission: EM | Admit: 2015-01-11 | Discharge: 2015-01-11 | Disposition: A | Discharge status: Home / Self Care | Payer: Self-pay | Attending: Emergency Medicine | Admitting: Emergency Medicine

## 2015-01-11 DIAGNOSIS — Z8719 Personal history of other diseases of the digestive system: Secondary | ICD-10-CM | POA: Insufficient documentation

## 2015-01-11 DIAGNOSIS — X58XXXA Exposure to other specified factors, initial encounter: Secondary | ICD-10-CM | POA: Insufficient documentation

## 2015-01-11 DIAGNOSIS — Y939 Activity, unspecified: Secondary | ICD-10-CM | POA: Insufficient documentation

## 2015-01-11 DIAGNOSIS — Y929 Unspecified place or not applicable: Secondary | ICD-10-CM | POA: Insufficient documentation

## 2015-01-11 DIAGNOSIS — S0502XA Injury of conjunctiva and corneal abrasion without foreign body, left eye, initial encounter: Secondary | ICD-10-CM | POA: Insufficient documentation

## 2015-01-11 DIAGNOSIS — Z87891 Personal history of nicotine dependence: Secondary | ICD-10-CM | POA: Insufficient documentation

## 2015-01-11 DIAGNOSIS — Y999 Unspecified external cause status: Secondary | ICD-10-CM | POA: Insufficient documentation

## 2015-01-11 MED ORDER — ERYTHROMYCIN 5 MG/GM OP OINT: TOPICAL_OINTMENT | OPHTHALMIC | Status: DC

## 2015-01-11 MED ORDER — TETRACAINE HCL 0.5 % OP SOLN
2.0000 [drp] | Freq: Once | OPHTHALMIC | Status: AC
  Administered 2015-01-11: 2 [drp] via OPHTHALMIC
  Filled 2015-01-11: qty 2

## 2015-01-11 MED ORDER — OXYCODONE-ACETAMINOPHEN 5-325 MG PO TABS: 1.0000 | ORAL_TABLET | ORAL | Status: DC | PRN

## 2015-01-11 MED ORDER — FLUORESCEIN SODIUM 1 MG OP STRP
1.0000 | ORAL_STRIP | Freq: Once | OPHTHALMIC | Status: AC
  Administered 2015-01-11: 1 via OPHTHALMIC
  Filled 2015-01-11: qty 1

## 2015-01-11 NOTE — ED Provider Notes (Signed)
CSN: [id number]     Arrival date & time 01/11/15  0246 History   First MD Initiated Contact with Patient 01/11/15 [phone]     Chief Complaint  Patient presents with  . Eye Pain     (Consider location/radiation/quality/duration/timing/severity/associated sxs/prior Treatment) Patient is a 29 y.o. male presenting with eye pain. The history is provided by the patient.  Eye Pain  He complains of pain in the left eye for the last 2 days. Pain is in the temporal aspect of the high and is worse with exposure to light. He rates pain at 7/10. There has been some mild, watery drainage. He has not noticed any worsening of vision. He does relate that he had a viral infection and wonders if that may be affecting his eye. He denies foreign body sensation and denies trauma.  Past Medical History  Diagnosis Date  . Dental abscess 02/15/2012   History reviewed. No pertinent past surgical history. No family history on file. History  Substance Use Topics  . Smoking status: Former Smoker    Types: Cigarettes  . Smokeless tobacco: Not on file  . Alcohol Use: Yes    Review of Systems  Eyes: Positive for pain.  All other systems reviewed and are negative.     Allergies  Shrimp  Home Medications   Prior to Admission medications   Not on File   BP 123/76 mmHg  Pulse 80  Temp(Src) 98.9 F (37.2 C) (Oral)  Resp 20  Ht 6\' 3"  (1.905 m)  Wt 200 lb (90.719 kg)  BMI 25.00 kg/m2  SpO2 97% Physical Exam  Nursing note and vitals reviewed.  29 year old male, resting comfortably and in no acute distress. Vital signs are normal. Oxygen saturation is 97%, which is normal. Head is normocephalic and atraumatic. PERRLA, EOMI. Oropharynx is clear. There is pain with light shined directly into the left eye but not when shined into the right eye. There is erythema of the conjunctiva of the left eye. Anterior chamber is clear and there is no obvious foreign body. Neck is nontender and supple without  adenopathy or JVD. Back is nontender and there is no CVA tenderness. Lungs are clear without rales, wheezes, or rhonchi. Chest is nontender. Heart has regular rate and rhythm without murmur. Abdomen is soft, flat, nontender without masses or hepatosplenomegaly and peristalsis is normoactive. Extremities have no cyanosis or edema, full range of motion is present. Skin is warm and dry without rash. Neurologic: Mental status is normal, cranial nerves are intact, there are no motor or sensory deficits.  ED Course  Procedures (including critical care time)   MDM   Final diagnoses:  Corneal abrasion, left, initial encounter    Left eye pain which may be conjunctivitis. Visual acuity is only slightly worse in the affected eye. He is taken to the slit-lamp where exam shows no corneal foreign bodies and no cells in the anterior chamber. The eye stained with flow seen and examined with cobalt blue filter and there are several areas of increased uptake in the inferior nasal quadrant of the cornea. This appearance is somewhat atypical. There is no dendritic character to any of the lesions. Case was discussed with Dr. Anderson Malta of ophthalmology agrees to see him in her office tomorrow. He is discharged with prescriptions for erythromycin ophthalmic ointment and oxycodone-acetaminophen.    Delora Fuel, MD 07/62/26 [id number]

## 2015-01-11 NOTE — ED Notes (Signed)
Pt. reports left eye pain with reddness and drainage onset 2 days ago , pt. also stated worsening infected acne at bridge of his nose .

## 2015-01-11 NOTE — ED Notes (Signed)
Pt a/o x4. Verbalizes understanding of d/c instructions to follow up with opthalmology tomorrow morning at 9 am. Steady gait upon exiting department. NAD.

## 2015-01-11 NOTE — Discharge Instructions (Signed)
Please see the ophthalmologist tomorrow morning at 9:00 AM. Her office is at [address] in [place]. Enter through the back of the building.   Corneal Abrasion The cornea is the clear covering at the front and center of the eye. When looking at the colored portion of the eye (iris), you are looking through the cornea. This very thin tissue is made up of many layers. The surface layer is a single layer of cells (corneal epithelium) and is one of the most sensitive tissues in the body. If a scratch or injury causes the corneal epithelium to come off, it is called a corneal abrasion. If the injury extends to the tissues below the epithelium, the condition is called a corneal ulcer. CAUSES   Scratches.  Trauma.  Foreign body in the eye. Some people have recurrences of abrasions in the area of the original injury even after it has healed (recurrent erosion syndrome). Recurrent erosion syndrome generally improves and goes away with time. SYMPTOMS   Eye pain.  Difficulty or inability to keep the injured eye open.  The eye becomes very sensitive to light.  Recurrent erosions tend to happen suddenly, first thing in the morning, usually after waking up and opening the eye. DIAGNOSIS  Your health care provider can diagnose a corneal abrasion during an eye exam. Dye is usually placed in the eye using a drop or a small paper strip moistened by your tears. When the eye is examined with a special light, the abrasion shows up clearly because of the dye. TREATMENT   Small abrasions may be treated with antibiotic drops or ointment alone.  A pressure patch may be put over the eye. If this is done, follow your doctor's instructions for when to remove the patch. Do not drive or use machines while the eye patch is on. Judging distances is hard to do with a patch on. If the abrasion becomes infected and spreads to the deeper tissues of the cornea, a corneal ulcer can result. This is serious  because it can cause corneal scarring. Corneal scars interfere with light passing through the cornea and cause a loss of vision in the involved eye. HOME CARE INSTRUCTIONS  Use medicine or ointment as directed. Only take over-the-counter or prescription medicines for pain, discomfort, or fever as directed by your health care provider.  Do not drive or operate machinery if your eye is patched. Your ability to judge distances is impaired.  If your health care provider has given you a follow-up appointment, it is very important to keep that appointment. Not keeping the appointment could result in a severe eye infection or permanent loss of vision. If there is any problem keeping the appointment, let your health care provider know. SEEK MEDICAL CARE IF:   You have pain, light sensitivity, and a scratchy feeling in one eye or both eyes.  Your pressure patch keeps loosening up, and you can blink your eye under the patch after treatment.  Any kind of discharge develops from the eye after treatment or if the lids stick together in the morning.  You have the same symptoms in the morning as you did with the original abrasion days, weeks, or months after the abrasion healed. MAKE SURE YOU:   Understand these instructions.  Will watch your condition.  Will get help right away if you are not doing well or get worse. Document Released: 06/26/2000 Document Revised: 07/04/2013 Document Reviewed: 03/06/2013 [facility] Patient Information 2015 [place], [place]. This information  is not intended to replace advice given to you by your health care provider. Make sure you discuss any questions you have with your health care provider.  Erythromycin eye ointment What is this medicine? ERYTHROMYCIN (er ith roe MYE sin) is a macrolide antibiotic. It is used to treat bacterial eye infections. It also prevents a certain type of eye infection that can occur in some babies. This medicine may be used for other purposes;  ask your health care provider or pharmacist if you have questions. COMMON BRAND NAME(S): Ilotycin, Romycin What should I tell my health care provider before I take this medicine? -if you have an unusual or allergic reaction to erythromycin, foods, dyes, or preservatives -pregnant or trying to get pregnant -breast-feeding How should I use this medicine? This medicine is only for use in the eye. Follow the directions on the prescription label. Wash hands before and after use. Tilt your head back slightly and pull your lower eyelid down with your index finger to form a pouch. Try not to touch the tip of the tube, to your eye, fingertips, or any other surface. Squeeze the end of the tube to apply a thin layer of the ointment to the inside of the lower eyelid. Close the eye gently to spread the ointment. Your vision may blur for a few minutes. Use your doses at regular intervals. Do not use your medicine more often than directed. Finish the full course prescribed by your doctor or health care professional even if you think your condition is better. Do not stop using except on the advice of your doctor or health care professional. Talk to your pediatrician regarding the use of this medicine in children. Special care may be needed. Overdosage: If you think you have taken too much of this medicine contact a poison control center or emergency room at once. NOTE: This medicine is only for you. Do not share this medicine with others. What if I miss a dose? If you miss a dose, use it as soon as you can. If it is almost time for your next dose, use only that dose. Do not use double or extra doses. What may interact with this medicine? Interactions are not expected. Do not use any other eye products without telling your doctor or health care professional. This list may not describe all possible interactions. Give your health care provider a list of all the medicines, herbs, non-prescription drugs, or dietary  supplements you use. Also tell them if you smoke, drink alcohol, or use illegal drugs. Some items may interact with your medicine. What should I watch for while using this medicine? Tell your doctor or health care professional if your symptoms do not improve in 2 to 3 days. What side effects may I notice from receiving this medicine? Side effects that you should report to your doctor or health care professional as soon as possible: -allergic reactions like skin rash, itching or hives, swelling of the face, lips, or tongue -burning, stinging, or itching of the eyes or eyelids -changes in vision -redness, swelling, or pain This list may not describe all possible side effects. Call your doctor for medical advice about side effects. You may report side effects to FDA at 1-800-FDA-1088. Where should I keep my medicine? Keep out of the reach of children. Store at room temperature between 15 and 30 degrees C (59 and 86 degrees F). Do not freeze. Throw away any unused ointment after the expiration date. NOTE: This sheet is a summary. It  may not cover all possible information. If you have questions about this medicine, talk to your doctor, pharmacist, or health care provider.  2015, Elsevier/Gold Standard. (2007-10-31 17:17:39)  Acetaminophen; Oxycodone tablets What is this medicine? ACETAMINOPHEN; OXYCODONE (a set a MEE noe fen; ox i KOE done) is a pain reliever. It is used to treat mild to moderate pain. This medicine may be used for other purposes; ask your health care provider or pharmacist if you have questions. COMMON BRAND NAME(S): Endocet, Magnacet, Narvox, Percocet, Perloxx, Primalev, Primlev, Roxicet, Xolox What should I tell my health care provider before I take this medicine? They need to know if you have any of these conditions: -brain tumor -Crohn's disease, inflammatory bowel disease, or ulcerative colitis -drug abuse or addiction -head injury -heart or circulation problems -if you  often drink alcohol -kidney disease or problems going to the bathroom -liver disease -lung disease, asthma, or breathing problems -an unusual or allergic reaction to acetaminophen, oxycodone, other opioid analgesics, other medicines, foods, dyes, or preservatives -pregnant or trying to get pregnant -breast-feeding How should I use this medicine? Take this medicine by mouth with a full glass of water. Follow the directions on the prescription label. Take your medicine at regular intervals. Do not take your medicine more often than directed. Talk to your pediatrician regarding the use of this medicine in children. Special care may be needed. Patients over 75 years old may have a stronger reaction and need a smaller dose. Overdosage: If you think you have taken too much of this medicine contact a poison control center or emergency room at once. NOTE: This medicine is only for you. Do not share this medicine with others. What if I miss a dose? If you miss a dose, take it as soon as you can. If it is almost time for your next dose, take only that dose. Do not take double or extra doses. What may interact with this medicine? -alcohol -antihistamines -barbiturates like amobarbital, butalbital, butabarbital, methohexital, pentobarbital, phenobarbital, thiopental, and secobarbital -benztropine -drugs for bladder problems like solifenacin, trospium, oxybutynin, tolterodine, hyoscyamine, and methscopolamine -drugs for breathing problems like ipratropium and tiotropium -drugs for certain stomach or intestine problems like propantheline, homatropine methylbromide, glycopyrrolate, atropine, belladonna, and dicyclomine -general anesthetics like etomidate, ketamine, nitrous oxide, propofol, desflurane, enflurane, halothane, isoflurane, and sevoflurane -medicines for depression, anxiety, or psychotic disturbances -medicines for sleep -muscle relaxants -naltrexone -narcotic medicines (opiates) for  pain -phenothiazines like perphenazine, thioridazine, chlorpromazine, mesoridazine, fluphenazine, prochlorperazine, promazine, and trifluoperazine -scopolamine -tramadol -trihexyphenidyl This list may not describe all possible interactions. Give your health care provider a list of all the medicines, herbs, non-prescription drugs, or dietary supplements you use. Also tell them if you smoke, drink alcohol, or use illegal drugs. Some items may interact with your medicine. What should I watch for while using this medicine? Tell your doctor or health care professional if your pain does not go away, if it gets worse, or if you have new or a different type of pain. You may develop tolerance to the medicine. Tolerance means that you will need a higher dose of the medication for pain relief. Tolerance is normal and is expected if you take this medicine for a long time. Do not suddenly stop taking your medicine because you may develop a severe reaction. Your body becomes used to the medicine. This does NOT mean you are addicted. Addiction is a behavior related to getting and using a drug for a non-medical reason. If you have pain, you have  a medical reason to take pain medicine. Your doctor will tell you how much medicine to take. If your doctor wants you to stop the medicine, the dose will be slowly lowered over time to avoid any side effects. You may get drowsy or dizzy. Do not drive, use machinery, or do anything that needs mental alertness until you know how this medicine affects you. Do not stand or sit up quickly, especially if you are an older patient. This reduces the risk of dizzy or fainting spells. Alcohol may interfere with the effect of this medicine. Avoid alcoholic drinks. There are different types of narcotic medicines (opiates) for pain. If you take more than one type at the same time, you may have more side effects. Give your health care provider a list of all medicines you use. Your doctor will  tell you how much medicine to take. Do not take more medicine than directed. Call emergency for help if you have problems breathing. The medicine will cause constipation. Try to have a bowel movement at least every 2 to 3 days. If you do not have a bowel movement for 3 days, call your doctor or health care professional. Do not take Tylenol (acetaminophen) or medicines that have acetaminophen with this medicine. Too much acetaminophen can be very dangerous. Many nonprescription medicines contain acetaminophen. Always read the labels carefully to avoid taking more acetaminophen. What side effects may I notice from receiving this medicine? Side effects that you should report to your doctor or health care professional as soon as possible: -allergic reactions like skin rash, itching or hives, swelling of the face, lips, or tongue -breathing difficulties, wheezing -confusion -light headedness or fainting spells -severe stomach pain -unusually weak or tired -yellowing of the skin or the whites of the eyes Side effects that usually do not require medical attention (report to your doctor or health care professional if they continue or are bothersome): -dizziness -drowsiness -nausea -vomiting This list may not describe all possible side effects. Call your doctor for medical advice about side effects. You may report side effects to FDA at 1-800-FDA-1088. Where should I keep my medicine? Keep out of the reach of children. This medicine can be abused. Keep your medicine in a safe place to protect it from theft. Do not share this medicine with anyone. Selling or giving away this medicine is dangerous and against the law. Store at room temperature between 20 and 25 degrees C (68 and 77 degrees F). Keep container tightly closed. Protect from light. This medicine may cause accidental overdose and death if it is taken by other adults, children, or pets. Flush any unused medicine down the toilet to reduce the  chance of harm. Do not use the medicine after the expiration date. NOTE: This sheet is a summary. It may not cover all possible information. If you have questions about this medicine, talk to your doctor, pharmacist, or health care provider.  2015, [employer]vier/Gold Standard. (2013-02-20 13:17:35)

## 2015-02-18 ENCOUNTER — Emergency Department (HOSPITAL_COMMUNITY): Payer: Medicaid Other

## 2015-02-18 ENCOUNTER — Inpatient Hospital Stay (HOSPITAL_COMMUNITY): Payer: Medicaid Other

## 2015-02-18 ENCOUNTER — Inpatient Hospital Stay (HOSPITAL_COMMUNITY)
Admission: EM | Admit: 2015-02-18 | Discharge: 2015-02-28 | DRG: 969 | Disposition: A | Discharge status: Other Acute Inpatient Hospital | Payer: Medicaid Other | Attending: Internal Medicine | Admitting: Internal Medicine

## 2015-02-18 ENCOUNTER — Encounter (HOSPITAL_COMMUNITY): Payer: Self-pay | Admitting: Emergency Medicine

## 2015-02-18 DIAGNOSIS — R4182 Altered mental status, unspecified: Secondary | ICD-10-CM

## 2015-02-18 DIAGNOSIS — C718 Malignant neoplasm of overlapping sites of brain: Secondary | ICD-10-CM | POA: Diagnosis present

## 2015-02-18 DIAGNOSIS — I10 Essential (primary) hypertension: Secondary | ICD-10-CM | POA: Diagnosis not present

## 2015-02-18 DIAGNOSIS — J95821 Acute postprocedural respiratory failure: Secondary | ICD-10-CM | POA: Diagnosis not present

## 2015-02-18 DIAGNOSIS — Z87891 Personal history of nicotine dependence: Secondary | ICD-10-CM

## 2015-02-18 DIAGNOSIS — B009 Herpesviral infection, unspecified: Secondary | ICD-10-CM | POA: Diagnosis present

## 2015-02-18 DIAGNOSIS — B0230 Zoster ocular disease, unspecified: Secondary | ICD-10-CM | POA: Diagnosis present

## 2015-02-18 DIAGNOSIS — D638 Anemia in other chronic diseases classified elsewhere: Secondary | ICD-10-CM | POA: Diagnosis present

## 2015-02-18 DIAGNOSIS — Z79899 Other long term (current) drug therapy: Secondary | ICD-10-CM

## 2015-02-18 DIAGNOSIS — E43 Unspecified severe protein-calorie malnutrition: Secondary | ICD-10-CM | POA: Diagnosis not present

## 2015-02-18 DIAGNOSIS — K5900 Constipation, unspecified: Secondary | ICD-10-CM | POA: Diagnosis not present

## 2015-02-18 DIAGNOSIS — Z91013 Allergy to seafood: Secondary | ICD-10-CM | POA: Diagnosis not present

## 2015-02-18 DIAGNOSIS — G9340 Encephalopathy, unspecified: Secondary | ICD-10-CM | POA: Diagnosis present

## 2015-02-18 DIAGNOSIS — E871 Hypo-osmolality and hyponatremia: Secondary | ICD-10-CM | POA: Diagnosis present

## 2015-02-18 DIAGNOSIS — Y838 Other surgical procedures as the cause of abnormal reaction of the patient, or of later complication, without mention of misadventure at the time of the procedure: Secondary | ICD-10-CM | POA: Diagnosis not present

## 2015-02-18 DIAGNOSIS — J9690 Respiratory failure, unspecified, unspecified whether with hypoxia or hypercapnia: Secondary | ICD-10-CM

## 2015-02-18 DIAGNOSIS — G9751 Postprocedural hemorrhage and hematoma of a nervous system organ or structure following a nervous system procedure: Secondary | ICD-10-CM | POA: Diagnosis not present

## 2015-02-18 DIAGNOSIS — C8589 Other specified types of non-Hodgkin lymphoma, extranodal and solid organ sites: Secondary | ICD-10-CM | POA: Insufficient documentation

## 2015-02-18 DIAGNOSIS — Z4659 Encounter for fitting and adjustment of other gastrointestinal appliance and device: Secondary | ICD-10-CM

## 2015-02-18 DIAGNOSIS — G936 Cerebral edema: Secondary | ICD-10-CM | POA: Diagnosis present

## 2015-02-18 DIAGNOSIS — Z978 Presence of other specified devices: Secondary | ICD-10-CM

## 2015-02-18 DIAGNOSIS — G9389 Other specified disorders of brain: Secondary | ICD-10-CM

## 2015-02-18 DIAGNOSIS — Z681 Body mass index (BMI) 19 or less, adult: Secondary | ICD-10-CM | POA: Diagnosis not present

## 2015-02-18 DIAGNOSIS — C8339 Diffuse large B-cell lymphoma, extranodal and solid organ sites: Principal | ICD-10-CM | POA: Diagnosis present

## 2015-02-18 DIAGNOSIS — Y92239 Unspecified place in hospital as the place of occurrence of the external cause: Secondary | ICD-10-CM | POA: Diagnosis not present

## 2015-02-18 DIAGNOSIS — C8599 Non-Hodgkin lymphoma, unspecified, extranodal and solid organ sites: Secondary | ICD-10-CM

## 2015-02-18 DIAGNOSIS — G939 Disorder of brain, unspecified: Secondary | ICD-10-CM

## 2015-02-18 DIAGNOSIS — R509 Fever, unspecified: Secondary | ICD-10-CM

## 2015-02-18 DIAGNOSIS — B20 Human immunodeficiency virus [HIV] disease: Secondary | ICD-10-CM | POA: Diagnosis present

## 2015-02-18 DIAGNOSIS — D496 Neoplasm of unspecified behavior of brain: Secondary | ICD-10-CM | POA: Insufficient documentation

## 2015-02-18 DIAGNOSIS — R4020 Unspecified coma: Secondary | ICD-10-CM

## 2015-02-18 DIAGNOSIS — J9600 Acute respiratory failure, unspecified whether with hypoxia or hypercapnia: Secondary | ICD-10-CM

## 2015-02-18 HISTORY — DX: Human immunodeficiency virus (HIV) disease: B20

## 2015-02-18 LAB — RAPID URINE DRUG SCREEN, HOSP PERFORMED
Amphetamines: NOT DETECTED
Barbiturates: NOT DETECTED
Benzodiazepines: NOT DETECTED
Cocaine: NOT DETECTED
Opiates: NOT DETECTED
Tetrahydrocannabinol: POSITIVE — AB

## 2015-02-18 LAB — CBC WITH DIFFERENTIAL/PLATELET
Basophils Absolute: 0 10*3/uL (ref 0.0–0.1)
Basophils Relative: 0 % (ref 0–1)
Eosinophils Absolute: 0 10*3/uL (ref 0.0–0.7)
Eosinophils Relative: 0 % (ref 0–5)
HCT: 48.1 % (ref 39.0–52.0)
Hemoglobin: 16.3 g/dL (ref 13.0–17.0)
Lymphocytes Relative: 6 % — ABNORMAL LOW (ref 12–46)
Lymphs Abs: 0.4 10*3/uL — ABNORMAL LOW (ref 0.7–4.0)
MCH: 28.3 pg (ref 26.0–34.0)
MCHC: 33.9 g/dL (ref 30.0–36.0)
MCV: 83.5 fL (ref 78.0–100.0)
MONO ABS: 0.8 10*3/uL (ref 0.1–1.0)
MONOS PCT: 11 % (ref 3–12)
Neutro Abs: 5.7 10*3/uL (ref 1.7–7.7)
Neutrophils Relative %: 83 % — ABNORMAL HIGH (ref 43–77)
Platelets: 373 10*3/uL (ref 150–400)
RBC: 5.76 MIL/uL (ref 4.22–5.81)
RDW: 13.6 % (ref 11.5–15.5)
WBC: 7 10*3/uL (ref 4.0–10.5)

## 2015-02-18 LAB — PROTIME-INR
INR: 1.24 (ref 0.00–1.49)
PROTHROMBIN TIME: 15.8 s — AB (ref 11.6–15.2)

## 2015-02-18 LAB — BASIC METABOLIC PANEL
ANION GAP: 12 (ref 5–15)
BUN: 23 mg/dL — AB (ref 6–20)
CO2: 28 mmol/L (ref 22–32)
CREATININE: 1.02 mg/dL (ref 0.61–1.24)
Calcium: 10.1 mg/dL (ref 8.9–10.3)
Chloride: 96 mmol/L — ABNORMAL LOW (ref 101–111)
GFR calc Af Amer: >60 mL/min (ref >60)
GFR calc non Af Amer: >60 mL/min (ref >60)
GLUCOSE: 118 mg/dL — AB (ref 65–99)
Potassium: 4.1 mmol/L (ref 3.5–5.1)
Sodium: 136 mmol/L (ref 135–145)

## 2015-02-18 LAB — APTT: aPTT: 36 seconds (ref 24–37)

## 2015-02-18 LAB — ACETAMINOPHEN LEVEL: Acetaminophen (Tylenol), Serum: <10 ug/mL — ABNORMAL LOW (ref 10–30)

## 2015-02-18 LAB — LACTIC ACID, PLASMA: Lactic Acid, Venous: 1.2 mmol/L (ref 0.5–2.0)

## 2015-02-18 LAB — I-STAT CG4 LACTIC ACID, ED: LACTIC ACID, VENOUS: 2.91 mmol/L — AB (ref 0.5–2.0)

## 2015-02-18 LAB — ETHANOL: Alcohol, Ethyl (B): <5 mg/dL (ref <5)

## 2015-02-18 LAB — SALICYLATE LEVEL

## 2015-02-18 MED ORDER — ONDANSETRON HCL 4 MG PO TABS: 4.0000 mg | ORAL_TABLET | Freq: Four times a day (QID) | ORAL | Status: DC | PRN

## 2015-02-18 MED ORDER — ACYCLOVIR SODIUM 50 MG/ML IV SOLN
10.0000 mg/kg | Freq: Three times a day (TID) | INTRAVENOUS | Status: DC
  Administered 2015-02-18 – 2015-02-19 (×4): 860 mg via INTRAVENOUS
  Filled 2015-02-18 (×6): qty 17.2

## 2015-02-18 MED ORDER — DEXAMETHASONE SODIUM PHOSPHATE 4 MG/ML IJ SOLN
10.0000 mg | Freq: Once | INTRAMUSCULAR | Status: AC
  Administered 2015-02-18: 10 mg via INTRAVENOUS
  Filled 2015-02-18: qty 3

## 2015-02-18 MED ORDER — DEXAMETHASONE SODIUM PHOSPHATE 4 MG/ML IJ SOLN
4.0000 mg | Freq: Four times a day (QID) | INTRAMUSCULAR | Status: DC
  Administered 2015-02-18 – 2015-02-20 (×7): 4 mg via INTRAVENOUS
  Filled 2015-02-18 (×11): qty 1

## 2015-02-18 MED ORDER — ONDANSETRON HCL 4 MG/2ML IJ SOLN: 4.0000 mg | Freq: Four times a day (QID) | INTRAMUSCULAR | Status: DC | PRN

## 2015-02-18 MED ORDER — DEXTROSE 5 % IV SOLN
2.0000 g | Freq: Two times a day (BID) | INTRAVENOUS | Status: DC
  Administered 2015-02-18: 2 g via INTRAVENOUS
  Filled 2015-02-18: qty 2

## 2015-02-18 MED ORDER — SODIUM CHLORIDE 0.9 % IV BOLUS (SEPSIS)
1000.0000 mL | Freq: Once | INTRAVENOUS | Status: AC
  Administered 2015-02-18: 1000 mL via INTRAVENOUS

## 2015-02-18 MED ORDER — VANCOMYCIN HCL IN DEXTROSE 1-5 GM/200ML-% IV SOLN
1000.0000 mg | Freq: Three times a day (TID) | INTRAVENOUS | Status: DC
  Administered 2015-02-18 – 2015-02-20 (×6): 1000 mg via INTRAVENOUS
  Filled 2015-02-18 (×8): qty 200

## 2015-02-18 MED ORDER — CEFTRIAXONE SODIUM 2 G IJ SOLR
2.0000 g | Freq: Two times a day (BID) | INTRAMUSCULAR | Status: DC
  Administered 2015-02-18 – 2015-02-20 (×4): 2 g via INTRAVENOUS
  Filled 2015-02-18 (×5): qty 2

## 2015-02-18 MED ORDER — ENOXAPARIN SODIUM 40 MG/0.4ML ~~LOC~~ SOLN
40.0000 mg | SUBCUTANEOUS | Status: DC
  Administered 2015-02-18 – 2015-02-19 (×2): 40 mg via SUBCUTANEOUS
  Filled 2015-02-18 (×3): qty 0.4

## 2015-02-18 MED ORDER — POLYETHYLENE GLYCOL 3350 17 G PO PACK
17.0000 g | PACK | Freq: Every day | ORAL | Status: DC | PRN
  Filled 2015-02-18: qty 1

## 2015-02-18 MED ORDER — DIPHENHYDRAMINE HCL 50 MG/ML IJ SOLN
12.5000 mg | Freq: Once | INTRAMUSCULAR | Status: AC
  Administered 2015-02-18: 12.5 mg via INTRAVENOUS
  Filled 2015-02-18: qty 1

## 2015-02-18 MED ORDER — SODIUM CHLORIDE 0.9 % IV SOLN
INTRAVENOUS | Status: DC
  Administered 2015-02-18 – 2015-02-20 (×3): via INTRAVENOUS

## 2015-02-18 MED ORDER — VANCOMYCIN HCL IN DEXTROSE 1-5 GM/200ML-% IV SOLN: 1000.0000 mg | Freq: Once | INTRAVENOUS | Status: DC

## 2015-02-18 MED ORDER — SODIUM CHLORIDE 0.9 % IJ SOLN
3.0000 mL | Freq: Two times a day (BID) | INTRAMUSCULAR | Status: DC
  Administered 2015-02-19 – 2015-02-20 (×4): 3 mL via INTRAVENOUS

## 2015-02-18 MED ORDER — VANCOMYCIN HCL 10 G IV SOLR
1750.0000 mg | INTRAVENOUS | Status: AC
  Administered 2015-02-18: 1750 mg via INTRAVENOUS
  Filled 2015-02-18: qty 1750

## 2015-02-18 MED ORDER — GADOBENATE DIMEGLUMINE 529 MG/ML IV SOLN
20.0000 mL | Freq: Once | INTRAVENOUS | Status: AC | PRN
  Administered 2015-02-18: 17 mL via INTRAVENOUS

## 2015-02-18 NOTE — Consult Note (Addendum)
NEURO HOSPITALIST CONSULT NOTE   Referring physician: ED Reason for Consult:altered mental status, abnormal CT brain  HPI:                                                                                                                                          Victor [name] is an 29 y.o. male with a past medical history that is relevant for recent (01/11/15) shingles left eye followed by opthalmology, dental abscess, brought to [facility] ED by ambulance for evaluation of the above stated symptoms. Patient is lethargic and can not contribute to his clinical information, thus all history was obtained from his medical record and his mother. His mother tells me that Victor [name] is currently under the care of opthalmology due to left eye shingles. She lives 3 hours away and keeps in contact with her son by the phone, as he is living in a hotel. She said that every time she talked to him since 7/1/ he will complain of a severe HA. Then, his mother indicated that the patient has been very sleepy and less responsive for the past 3 days. Stated that today she had a "bad feeling," and came to see the patient herself and realized that he was poorly responsive, not being himself. It was at this point that the patient was brought to the emergency department by EMS. He has been reportedly afebrile and had a normal white count in the ED. However, CT brain which was personally reviewed demonstrated is heterogeneous mass in right basal ganglia just lateral to caudate no gross measures at least 3.5 cm, with significant vasogenic edema in right temporal lobe and right frontal lobe. There is about 5 mm right to left midline shift at the level of the lesion. There is mass effect on right frontal horn. Available serologies are not particularly impressive.     Past Medical History  Diagnosis Date  . Dental abscess 02/15/2012    History reviewed. No pertinent past surgical history.  History reviewed.  No pertinent family history.  Family History: unable to obtain due to mental status.   Social History:  reports that he has quit smoking. His smoking use included Cigarettes. He does not have any smokeless tobacco history on file. He reports that he drinks alcohol. He reports that he does not use illicit drugs.  Allergies  Allergen Reactions  . Shrimp [Shellfish Allergy] Swelling  . Fish Allergy Swelling    MEDICATIONS:  I have reviewed the patient's current medications.   ROS: unable to obtain due to mental status.                                                                                                                                      History obtained from chart review and mother   Physical exam: lethargic, Blood pressure 108/80, pulse 105, temperature 100.5 F (38.1 C), temperature source Rectal, resp. rate 16, weight 86.183 kg (190 lb), SpO2 95 %. Head: normocephalic. Neck: supple, no bruits, no JVD. Cardiac: no murmurs. Lungs: clear. Abdomen: soft, no tender, no mass. Extremities: no edema. Skin: healing pustular rash left periorbital region  Neurologic Examination:                                                                                                      Mental status: lethargic, grimaces to pain but doesn't follow commands. CN 2-12: resists eye opening, pupils 1 mm, poorly reactive. No gaze preference. Blinks to threat. Face appears symmetric. Tongue couldn't be assessed. Motor: moves all limbs upon noxious stimuli. Sensory: withdraws to pain. DTR's: 2 all over. Plantars: downgoing. Coordination and gait: unable to test due to mental status. Patient wears a cervical collar and thus couldn't test for meningeal signs.  No results found for: CHOL  Results for orders placed or performed during the hospital encounter of 02/18/15 (from the  past 48 hour(s))  CBC with Differential/Platelet     Status: Abnormal   Collection Time: 02/18/15 10:36 AM  Result Value Ref Range   WBC 7.0 4.0 - 10.5 K/uL   RBC 5.76 4.22 - 5.81 MIL/uL   Hemoglobin 16.3 13.0 - 17.0 g/dL   HCT 48.1 39.0 - 52.0 %   MCV 83.5 78.0 - 100.0 fL   MCH 28.3 26.0 - 34.0 pg   MCHC 33.9 30.0 - 36.0 g/dL   RDW 13.6 11.5 - 15.5 %   Platelets 373 150 - 400 K/uL   Neutrophils Relative % 83 (H) 43 - 77 %   Neutro Abs 5.7 1.7 - 7.7 K/uL   Lymphocytes Relative 6 (L) 12 - 46 %   Lymphs Abs 0.4 (L) 0.7 - 4.0 K/uL   Monocytes Relative 11 3 - 12 %   Monocytes Absolute 0.8 0.1 - 1.0 K/uL   Eosinophils Relative 0 0 - 5 %   Eosinophils Absolute 0.0 0.0 - 0.7 K/uL   Basophils Relative 0 0 - 1 %   Basophils Absolute 0.0  0.0 - 0.1 K/uL  I-Stat CG4 Lactic Acid, ED     Status: Abnormal   Collection Time: 02/18/15 10:41 AM  Result Value Ref Range   Lactic Acid, Venous 2.91 (HH) 0.5 - 2.0 mmol/L   Comment NOTIFIED PHYSICIAN     Ct Head Wo Contrast  02/18/2015   CLINICAL DATA:  Neck pain, shingles  EXAM: CT HEAD WITHOUT CONTRAST  TECHNIQUE: Contiguous axial images were obtained from the base of the skull through the vertex without intravenous contrast.  COMPARISON:  None.  FINDINGS: No skull fracture is noted. There is no intracranial hemorrhage. There is heterogeneous mass in right basal ganglia just lateral to caudate no gross measures at least 3.5 cm. There is vasogenic edema in right temporal lobe and right frontal lobe. There is about 5 mm right to left midline shift at the level of the lesion. There is mass effect on right frontal horn. No intraventricular hemorrhage.  IMPRESSION: Nodular heterogeneous mass in right basal ganglia/ lateral to caudate nucleus measures at least 3.5 cm. Vasogenic edema in right frontal and right temporal lobe. Mass effect on right lateral ventricle. No intraventricular hemorrhage.  Further evaluation with unenhanced and enhanced MRI is recommended.  These results were called by telephone at the time of interpretation on 02/18/2015 at 10:57 am to Dr. Montine Circle , who verbally acknowledged these results.   Electronically Signed   By: Lahoma Crocker M.D.   On: 02/18/2015 10:58    Assessment/Plan: 29 y/o with recent shingles left eye, brought in due to altered mental status. CT brain with findings concerning for a heterogeneous mass in right basal ganglia just lateral to caudate no gross measures at least 3.5 cm, with significant vasogenic edema in right temporal lobe and right frontal lobe. There is about 5 mm right to left midline shift at the level of the lesion. Can not exclude the possibility of an infectious manifestation CNS shingles but a primary space occupying lesion also high in the differential. Altered mental status with significant vasogenic edema are certainly indicative of increased intracranial pressure which precludes LP at this moment, and therefore will recommend commencing empiric antibiotic treatment. MRI brain with and without contrast to better characterized CT findings. Decadron 10 mg x 1 dose now pending MRI brain results. Will suggest transfer to [facility]. EEG (can be done when patient [facility] to [facility]) Will follow up.    Dorian Pod, MD 02/18/2015, 11:42 AM

## 2015-02-18 NOTE — ED Notes (Signed)
Pt in MRI.

## 2015-02-18 NOTE — ED Provider Notes (Signed)
CSN: [id number]     Arrival date & time 02/18/15  1540 History   First MD Initiated Contact with Patient 02/18/15 [phone]     Chief Complaint  Patient presents with  . Altered Mental Status     (Consider location/radiation/quality/duration/timing/severity/associated sxs/prior Treatment) HPI Comments: History provided by parents.  Patient presents to the ED with a chief complaint of AMS.  Patient was reportedly recently diagnosed with shingles ophthalmicus by ophthalmology. He was prescribed valacyclovir, which he has been taking as directed.  Patient's mother reports that the patient has been very sleepy and less responsive for the past 3 days. Family member stay 3 hours away, but today, the mother had a "bad feeling," and came to see the patient herself. It was at this point that the patient was brought to the emergency department by EMS. Patient is unable to contribute to his history given drowsiness/altered mental status. Mother states that the patient had been complaining of a headache and some neck pain. Level V caveat applies secondary to altered mental status.  The history is provided by a parent. No language interpreter was used.    Past Medical History  Diagnosis Date  . Dental abscess 02/15/2012   History reviewed. No pertinent past surgical history. History reviewed. No pertinent family history. History  Substance Use Topics  . Smoking status: Former Smoker    Types: Cigarettes  . Smokeless tobacco: Not on file  . Alcohol Use: Yes    Review of Systems  Unable to perform ROS: Mental status change      Allergies  Shrimp and Fish allergy  Home Medications   Prior to Admission medications   Medication Sig Start Date End Date Taking? Authorizing Provider  cyclopentolate (CYCLODRYL,CYCLOGYL) 1 % ophthalmic solution Place 1 drop into the left eye 3 (three) times daily.   Yes Historical Provider, MD  prednisoLONE acetate (PRED FORTE) 1 % ophthalmic suspension Place 1 drop  into the left eye 4 (four) times daily.   Yes Historical Provider, MD  valACYclovir (VALTREX) 1000 MG tablet Take 1,000 mg by mouth 3 (three) times daily. For 10 days 02/08/15  Yes Historical Provider, MD  erythromycin ophthalmic ointment Place a 1/2 inch ribbon of ointment into the lower eyelid. Patient not taking: Reported on 02/18/2015 12/11/93   Delora Fuel, MD  oxyCODONE-acetaminophen (PERCOCET) 5-325 MG per tablet Take 1 tablet by mouth every 4 (four) hours as needed for moderate pain. Patient not taking: Reported on 02/18/2015 0/9/32   Delora Fuel, MD   BP 671/24 mmHg  Pulse 105  Temp(Src) 98.8 F (37.1 C) (Oral)  Resp 16  SpO2 95% Physical Exam  Constitutional: He appears well-developed and well-nourished.  HENT:  Head: Normocephalic and atraumatic.  Eyes: Conjunctivae and EOM are normal. Right eye exhibits no discharge. Left eye exhibits no discharge. No scleral icterus.  Pupils pinpoint  Neck: Normal range of motion. Neck supple. No JVD present.  Cardiovascular: Regular rhythm and normal heart sounds.  Exam reveals no gallop and no friction rub.   No murmur heard. Mildly tachycardic  Pulmonary/Chest: Effort normal and breath sounds normal. No respiratory distress. He has no wheezes. He has no rales. He exhibits no tenderness.  Abdominal: Soft. He exhibits no distension and no mass. There is no tenderness. There is no rebound and no guarding.  No pain response with palpation  Musculoskeletal: Normal range of motion. He exhibits no edema or tenderness.  Neurological:  Somnolent, responds to painful stimuli  Skin: Skin is warm and  dry. No rash noted.  Psychiatric:  Unable to examine 2/2 mental status  Nursing note and vitals reviewed.   ED Course  Procedures (including critical care time) Results for orders placed or performed during the hospital encounter of 02/18/15  CBC with Differential/Platelet  Result Value Ref Range   WBC 7.0 4.0 - 10.5 K/uL   RBC 5.76 4.22 - 5.81  MIL/uL   Hemoglobin 16.3 13.0 - 17.0 g/dL   HCT 48.1 39.0 - 52.0 %   MCV 83.5 78.0 - 100.0 fL   MCH 28.3 26.0 - 34.0 pg   MCHC 33.9 30.0 - 36.0 g/dL   RDW 13.6 11.5 - 15.5 %   Platelets 373 150 - 400 K/uL   Neutrophils Relative % 83 (H) 43 - 77 %   Neutro Abs 5.7 1.7 - 7.7 K/uL   Lymphocytes Relative 6 (L) 12 - 46 %   Lymphs Abs 0.4 (L) 0.7 - 4.0 K/uL   Monocytes Relative 11 3 - 12 %   Monocytes Absolute 0.8 0.1 - 1.0 K/uL   Eosinophils Relative 0 0 - 5 %   Eosinophils Absolute 0.0 0.0 - 0.7 K/uL   Basophils Relative 0 0 - 1 %   Basophils Absolute 0.0 0.0 - 0.1 K/uL  Basic metabolic panel  Result Value Ref Range   Sodium 136 135 - 145 mmol/L   Potassium 4.1 3.5 - 5.1 mmol/L   Chloride 96 (L) 101 - 111 mmol/L   CO2 28 22 - 32 mmol/L   Glucose, Bld 118 (H) 65 - 99 mg/dL   BUN 23 (H) 6 - 20 mg/dL   Creatinine, Ser 1.02 0.61 - 1.24 mg/dL   Calcium 10.1 8.9 - 10.3 mg/dL   GFR calc non Af Amer >60 >60 mL/min   GFR calc Af Amer >60 >60 mL/min   Anion gap 12 5 - 15  Ethanol  Result Value Ref Range   Alcohol, Ethyl (B) <5 <5 mg/dL  Salicylate level  Result Value Ref Range   Salicylate Lvl <6.3 2.8 - 30.0 mg/dL  Acetaminophen level  Result Value Ref Range   Acetaminophen (Tylenol), Serum <10 (L) 10 - 30 ug/mL  I-Stat CG4 Lactic Acid, ED  Result Value Ref Range   Lactic Acid, Venous 2.91 (HH) 0.5 - 2.0 mmol/L   Comment NOTIFIED PHYSICIAN    Ct Head Wo Contrast  02/18/2015   CLINICAL DATA:  Neck pain, shingles  EXAM: CT HEAD WITHOUT CONTRAST  TECHNIQUE: Contiguous axial images were obtained from the base of the skull through the vertex without intravenous contrast.  COMPARISON:  None.  FINDINGS: No skull fracture is noted. There is no intracranial hemorrhage. There is heterogeneous mass in right basal ganglia just lateral to caudate no gross measures at least 3.5 cm. There is vasogenic edema in right temporal lobe and right frontal lobe. There is about 5 mm right to left midline  shift at the level of the lesion. There is mass effect on right frontal horn. No intraventricular hemorrhage.  IMPRESSION: Nodular heterogeneous mass in right basal ganglia/ lateral to caudate nucleus measures at least 3.5 cm. Vasogenic edema in right frontal and right temporal lobe. Mass effect on right lateral ventricle. No intraventricular hemorrhage.  Further evaluation with unenhanced and enhanced MRI is recommended. These results were called by telephone at the time of interpretation on 02/18/2015 at 10:57 am to Dr. Montine Circle , who verbally acknowledged these results.   Electronically Signed   By: Orlean Bradford.D.  On: 02/18/2015 10:58      EKG Interpretation None      MDM   Final diagnoses:  Altered mental status, unspecified altered mental status type    Concern for encephalitis.  Will check head CT, start IV abx and antivirals.    Patient immediately discussed with Dr. Lacinda Axon, who agrees with plan for antivirals and LP.  Patient seen by and discussed with Dr. Lacinda Axon.  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   11:15 AM Spoke with Dr. Aram Beecham from neurology, who advises against lumbar puncture secondary to risk of herniation, recommends MRI brain with and without contrast, continuing IV and antibiotics and antivirals, and transferred to [facility] for further workup and evaluation. Medications  cefTRIAXone (ROCEPHIN) 2 g in dextrose 5 % 50 mL IVPB (0 g Intravenous Stopped 02/18/15 1243)  acyclovir (ZOVIRAX) 860 mg in dextrose 5 % 150 mL IVPB (0 mg Intravenous Paused 02/18/15 1322)  vancomycin (VANCOCIN) 1,750 mg in sodium chloride 0.9 % 500 mL IVPB (1,750 mg Intravenous New Bag/Given 02/18/15 1351)  vancomycin (VANCOCIN) IVPB 1000 mg/200 mL premix (not administered)  sodium chloride 0.9 % bolus 1,000 mL (0 mLs Intravenous Stopped 02/18/15 1122)  sodium chloride 0.9 % bolus 1,000 mL (1,000 mLs Intravenous New Bag/Given 02/18/15 1122)  dexamethasone  (DECADRON) injection 10 mg (10 mg Intravenous Given 02/18/15 1233)    CRITICAL CARE Performed by: Montine Circle   Total critical care time: 50  Critical care time was exclusive of separately billable procedures and treating other patients.  Critical care was necessary to treat or prevent imminent or life-threatening deterioration.  Critical care was time spent personally by me on the following activities: development of treatment plan with patient and/or surrogate as well as nursing, discussions with consultants, evaluation of patient's response to treatment, examination of patient, obtaining history from patient or surrogate, ordering and performing treatments and interventions, ordering and review of laboratory studies, ordering and review of radiographic studies, pulse oximetry and re-evaluation of patient's condition.      Montine Circle, PA-C 02/18/15 [place], MD 02/19/15 [phone]

## 2015-02-18 NOTE — ED Notes (Signed)
MD at bedside. 

## 2015-02-18 NOTE — ED Notes (Signed)
POC Lactic 2.91 reported to [employer] C. And PA Robert B.

## 2015-02-18 NOTE — ED Notes (Signed)
Pt's mother reports pt's upper lip looks swollen compared to arrival. No respiratory distress noted. VS WNL. Stopped only IV medication acyclovir and sent message to hospitalist.

## 2015-02-18 NOTE — ED Notes (Signed)
Pt transferred to [place] [facility] via [facility] 

## 2015-02-18 NOTE — ED Notes (Signed)
PA at bedside.

## 2015-02-18 NOTE — ED Notes (Signed)
Bed: QN99 Expected date:  Expected time:  Means of arrival:  Comments: rm 5

## 2015-02-18 NOTE — ED Notes (Addendum)
Per PTAR. Pt recently diagnosed with shingles on eye by opthamology. Family went to check on pt this am and found he was very drowsy. Pt drowsy with EMS. Responsive to physical stimuli with PTAR. Pt told PTAR he had a fall over the weekend with neck pain. Pt found with benedryl in room. Pt drowsy during triage, would groan with sternal rub and rub face. Pt has pinpoint pupils.

## 2015-02-18 NOTE — ED Notes (Signed)
Report received from Janetta Hora. RN  Pt has ICP/meningitis but this is not reported as meningitis shingles (recent history of)  Pt has only been responsive to painful stimuli.  Pt has pin point pupils

## 2015-02-18 NOTE — ED Notes (Signed)
Call received from [facility] [employer] r/t patio[facility] sisters return home

## 2015-02-18 NOTE — ED Notes (Signed)
Bed: HU31 Expected date:  Expected time:  Means of arrival:  Comments: EMS- 29yo M, general malaise

## 2015-02-18 NOTE — Progress Notes (Addendum)
RN, Lattie Haw, paged this NP secondary to left eye deviating up and laterally. RN doesn't know if this is new.  Pt admitted earlier with AMS and incidentally found to have a brain mass with vasogenic edema and mass effect. Neuro has seen him in consult and this NP reviewed that note. Pt is on Decadron IV now. NP to see pt.  S: He is non verbal so cannot participate in ROS. However, male at bedside says he is doing more for this NP that he has been doing since arrival.  O: Acutely ill 29 yo AAM in NAD. VS reviewed. Pt is non verbal. However, he is following some commands now. Grip 3 left, 2 right. Moves all 4 extremities spontaneously. Able to hold his arms up in the air on his own, left more than right. He protrudes his tongue only slightly on command. Seems to be midline. He will not open his eyes and resists exam. This NP was able to pry the left eye open and he is able to move the eye left and right, but not on command. Can not do bilateral eye exam to compare EOMs or pupils due to lack of cooperation.  A/P: 1. AMS secondary to brain mass, new, with mass effect and vasogenic edema. Work up in progress. This NP called neuro, Dr. Leonel Ramsay. He was aware of pt as day shift neuro had reported pt to him. Dr. Leonel Ramsay has viewed the MRI. He agrees with the Decadron and transfer to step down. Do not feel pt warrants ICU at this time. Discussed pt's progress since previous exams-likely due to Decadron. Neuro to follow.  Discussed plan with male and male at bedside (? Parents). Awaiting a SDU at [facility]. May d/c C-Collar-spine films are neg for dislocation or fx.  Clance Boll, NP [facility] Addendum: The male and male at bedside are pt's parents. After talking with neuro, this NP updated them on the plan. Explained the Decadron has improved his mental status some. Also, discussed severity of his illness. They are aware and expressed gratitude for our care. Pt transferred to SDU at [facility]. KJKG,  NP

## 2015-02-18 NOTE — Progress Notes (Addendum)
CM spoke with pt's mother Judson Roch who confirms uninsured [employer] resident with no pcp.  CM discussed and provided written information for uninsured accepting pcps, discussed the importance of pcp vs EDP services for f/u care, www.needymeds.org, www.goodrx.com, discounted pharmacies and other [employer] such as [employer], affordable care act, financial assistance, uninsured dental services, [place] med assist, DSS and  health department  Reviewed resources for [employer] uninsured accepting pcps like [facility], family medicine at [employer], [facility], [facility], local urgent care centers, [facility], [facility] family practice, general medical clinics, family services of the [place], [facility] urgent care plus others, medication resources, [facility] out patient pharmacies and housing Pt voiced understanding and appreciation of resources provided   Provided [facility] contact information Pt agreed to a referral Cm completed referral Pt to be contact by [facility] clinical liason  Mother Sarah cell [phone] home --- [phone]

## 2015-02-18 NOTE — ED Notes (Signed)
Patient transported to CT 

## 2015-02-18 NOTE — H&P (Addendum)
[facility] History and Physical  Victor [name] CHE:[id number] DOB: [birth year] DOA: 02/18/2015  Referring physician: Dr. Lacinda Axon PCP: [name]   Chief Complaint: confused  HPI: Victor [name] is a 29 y.o. male with no significant past medical history except for shingles of the left eye couple weeks prior to admission he was started on antiretroviral therapy and it improved. Most of the history is obtained from the mother asked the patient is not able to communicate. The mother relates that he had been having headache for the past 2 weeks, but 3 days prior to admission he was so lethargic that they couldn't communicate with him. Asperger neighbors he had been so sleepy for the past 3 days that all he was to do is stay in the house as he was feeling bad. His brother could not wake him up today prior to admission. So his mom and father drove down to COU was doing. When he got to the house the patient was confused on the floor unable to follow commands and would not even recognize them. The mother denies that her son uses IV drugs. She relates that as far she knows he doesn't have HIV. She relates she has been losing weight for the last 3 months. Is unsure about fever.  In the ED: He developed a fever a CT scan of the brain was done that shows a mass in the right basal ganglia just lateral to the Caltrate measuring about 3.5 cm with significant vasomotor genic edema of the right temporal lobe and frontal lobe. It was mild midline shift.   Review of Systems:  Constitutional:  No weight loss, night sweats, Fevers, chills, fatigue.    Past Medical History  Diagnosis Date  . Dental abscess 02/15/2012   History reviewed. No pertinent past surgical history. Social History:  reports that he has quit smoking. His smoking use included Cigarettes. He does not have any smokeless tobacco history on file. He reports that he drinks alcohol. He reports that he does not use illicit  drugs.  Allergies  Allergen Reactions  . Shrimp [Shellfish Allergy] Swelling  . Fish Allergy Swelling      No significant family medical history for leukemia lymphoma or glioblastoma multiforme. Prior to Admission medications   Medication Sig Start Date End Date Taking? Authorizing Provider  cyclopentolate (CYCLODRYL,CYCLOGYL) 1 % ophthalmic solution Place 1 drop into the left eye 4 (four) times daily.    Yes Historical Provider, MD  prednisoLONE acetate (PRED FORTE) 1 % ophthalmic suspension Place 1 drop into the right eye 4 (four) times daily.    Yes Historical Provider, MD  tobramycin (TOBREX) 0.3 % ophthalmic solution Place 1 drop into the left eye 4 (four) times daily.   Yes Historical Provider, MD  valACYclovir (VALTREX) 1000 MG tablet Take 1,000 mg by mouth 3 (three) times daily. For 10 days 02/08/15  Yes Historical Provider, MD  erythromycin ophthalmic ointment Place a 1/2 inch ribbon of ointment into the lower eyelid. 11/13/59   Delora Fuel, MD  oxyCODONE-acetaminophen (PERCOCET) 5-325 MG per tablet Take 1 tablet by mouth every 4 (four) hours as needed for moderate pain. Patient not taking: Reported on 02/18/2015 10/14/29   Delora Fuel, MD   Physical Exam: Filed Vitals:   02/18/15 0953 02/18/15 1055 02/18/15 1137 02/18/15 1145  BP: 108/80   119/78  Pulse: 105   100  Temp: 98.8 F (37.1 C)  100.5 F (38.1 C)   TempSrc: Oral  Rectal  Resp: 16   30  Weight:  86.183 kg (190 lb)    SpO2: 95%   98%    Wt Readings from Last 3 Encounters:  02/18/15 86.183 kg (190 lb)  01/11/15 90.719 kg (200 lb)  09/03/14 97.523 kg (215 lb)    General:  Lethargic. Eyes: PERRL, normal lids, irises & conjunctiva ENT: grossly normal hearing, lips & tongue Neck: no LAD, masses or thyromegaly, c-collar Cardiovascular: RRR, no m/r/g. No LE edema. Telemetry: SR, no arrhythmias  Respiratory: CTA bilaterally, no w/r/r. Normal respiratory effort. Abdomen: soft, ntnd Skin: no rash or induration seen  on limited exam Neurologic: Lethargic but able to follow commands.           Labs on Admission:  Basic Metabolic Panel:  Recent Labs Lab 02/18/15 1036  NA 136  K 4.1  CL 96*  CO2 28  GLUCOSE 118*  BUN 23*  CREATININE 1.02  CALCIUM 10.1   Liver Function Tests: No results for input(s): AST, ALT, ALKPHOS, BILITOT, PROT, ALBUMIN in the last 168 hours. No results for input(s): LIPASE, AMYLASE in the last 168 hours. No results for input(s): AMMONIA in the last 168 hours. CBC:  Recent Labs Lab 02/18/15 1036  WBC 7.0  NEUTROABS 5.7  HGB 16.3  HCT 48.1  MCV 83.5  PLT 373   Cardiac Enzymes: No results for input(s): CKTOTAL, CKMB, CKMBINDEX, TROPONINI in the last 168 hours.  BNP (last 3 results) No results for input(s): BNP in the last 8760 hours.  ProBNP (last 3 results) No results for input(s): PROBNP in the last 8760 hours.  CBG: No results for input(s): GLUCAP in the last 168 hours.  Radiological Exams on Admission: Ct Head Wo Contrast  02/18/2015   CLINICAL DATA:  Neck pain, shingles  EXAM: CT HEAD WITHOUT CONTRAST  TECHNIQUE: Contiguous axial images were obtained from the base of the skull through the vertex without intravenous contrast.  COMPARISON:  None.  FINDINGS: No skull fracture is noted. There is no intracranial hemorrhage. There is heterogeneous mass in right basal ganglia just lateral to caudate no gross measures at least 3.5 cm. There is vasogenic edema in right temporal lobe and right frontal lobe. There is about 5 mm right to left midline shift at the level of the lesion. There is mass effect on right frontal horn. No intraventricular hemorrhage.  IMPRESSION: Nodular heterogeneous mass in right basal ganglia/ lateral to caudate nucleus measures at least 3.5 cm. Vasogenic edema in right frontal and right temporal lobe. Mass effect on right lateral ventricle. No intraventricular hemorrhage.  Further evaluation with unenhanced and enhanced MRI is recommended.  These results were called by telephone at the time of interpretation on 02/18/2015 at 10:57 am to Dr. Montine Circle , who verbally acknowledged these results.   Electronically Signed   By: Lahoma Crocker M.D.   On: 02/18/2015 10:58    EKG: Independently reviewed none  Assessment/Plan Active Problems:   Acute encephalopathy   Brain mass   Vasogenic brain edema   Severe protein-calorie malnutrition   Fever  - The ED has already consulted neurology, who recommended an MRI, I think we should get a CT of the neck as we are not able to assess his neurological exam to rule out any C-spine fractures. - He has developed mild rectal temperature  but has no leukocytosis - I'll go ahead and start him on IV vancomycin, Rocephin and acyclovir. At he is at risk of Herpes encephalitis. - Start him on  IV Decadron. As he has a mass lumbar puncture is contraindicated at this point. - His lactic acid is mildly elevated, I will go ahead and continue him on maintenance IV fluids. - Check an HIV, CD4 count and viral load. As this population is at significant risk of HIV associated lymphoma.   Code Status: full  DVT Prophylaxis:heparin Family Communication: mother Disposition Plan: inpatient  Time spent: 3 min  Charlynne Cousins [facility] Pager [phone]

## 2015-02-18 NOTE — Progress Notes (Addendum)
ANTIBIOTIC CONSULT NOTE - INITIAL  Pharmacy Consult for Vancomycin Indication: meningitis  Allergies  Allergen Reactions  . Shrimp [Shellfish Allergy] Swelling  . Fish Allergy Swelling    Patient Measurements: Weight: 190 lb (86.183 kg) Height: 6'3"  Vital Signs: Temp: 100.5 F (38.1 C) (08/08 1137) Temp Source: Rectal (08/08 1137) BP: 117/74 mmHg (08/08 1319) Pulse Rate: 94 (08/08 1319) Intake/Output from previous day:   Intake/Output from this shift:    Labs:  Recent Labs  02/18/15 1036  WBC 7.0  HGB 16.3  PLT 373  CREATININE 1.02   Estimated Creatinine Clearance: 127.7 mL/min (by C-G formula based on Cr of 1.02). No results for input(s): VANCOTROUGH, VANCOPEAK, VANCORANDOM, GENTTROUGH, GENTPEAK, GENTRANDOM, TOBRATROUGH, TOBRAPEAK, TOBRARND, AMIKACINPEAK, AMIKACINTROU, AMIKACIN in the last 72 hours.   Microbiology: No results found for this or any previous visit (from the past 720 hour(s)).  Medical History: Past Medical History  Diagnosis Date  . Dental abscess 02/15/2012     Assessment: 58 y/oM with PMH of dental abscess, recently diagnosed with shingles of L eye (01/11/15) who presents to [facility] ED today with altered mental status. Concern for encephalitis/meningitis/CNS shingles. CT scan of head revealed nodular heterogeneous mass in right basal ganglia/ lateral to caudate nucleus measuring at least 3.5 cm with vasogenic edema in right frontal and right temporal lobe. Pharmacy consulted to assist with dosing of Vancomycin for this patient.   8/8 >> Vancomycin >> 8/8 >> Ceftriaxone >> 8/8 >> Acyclovir >>    8/8 blood x 2: sent  Tmax: 100.50F WBC WNL SCr 1.02 with CrCl > 100 ml/min   Goal of Therapy:  Vancomycin trough level 15-20 mcg/ml  Appropriate antibiotic dosing for renal function and indication Eradication of infection  Plan:   Vancomycin 1750 mg IV x 1 STAT. Continue with Vancomycin 1g IV q8h.  Plan for Vancomycin trough level at steady  state.  Monitor renal function, cultures, clinical course.   Lindell Spar, PharmD, BCPS Pager: [phone] 02/18/2015 10:55 AM

## 2015-02-19 DIAGNOSIS — R4182 Altered mental status, unspecified: Secondary | ICD-10-CM | POA: Insufficient documentation

## 2015-02-19 LAB — BASIC METABOLIC PANEL
Anion gap: 11 (ref 5–15)
BUN: 12 mg/dL (ref 6–20)
CALCIUM: 8.8 mg/dL — AB (ref 8.9–10.3)
CO2: 22 mmol/L (ref 22–32)
Chloride: 101 mmol/L (ref 101–111)
Creatinine, Ser: 0.75 mg/dL (ref 0.61–1.24)
GFR calc Af Amer: >60 mL/min (ref >60)
GFR calc non Af Amer: >60 mL/min (ref >60)
GLUCOSE: 129 mg/dL — AB (ref 65–99)
POTASSIUM: 4.4 mmol/L (ref 3.5–5.1)
SODIUM: 134 mmol/L — AB (ref 135–145)

## 2015-02-19 LAB — CBC
HCT: 35.3 % — ABNORMAL LOW (ref 39.0–52.0)
HEMOGLOBIN: 12 g/dL — AB (ref 13.0–17.0)
MCH: 27.8 pg (ref 26.0–34.0)
MCHC: 34 g/dL (ref 30.0–36.0)
MCV: 81.9 fL (ref 78.0–100.0)
PLATELETS: 314 10*3/uL (ref 150–400)
RBC: 4.31 MIL/uL (ref 4.22–5.81)
RDW: 13.5 % (ref 11.5–15.5)
WBC: 4.8 10*3/uL (ref 4.0–10.5)

## 2015-02-19 LAB — RETICULOCYTES
RBC.: 4.18 MIL/uL — AB (ref 4.22–5.81)
Retic Count, Absolute: 20.9 10*3/uL (ref 19.0–186.0)
Retic Ct Pct: 0.5 % (ref 0.4–3.1)

## 2015-02-19 LAB — LACTIC ACID, PLASMA
LACTIC ACID, VENOUS: 1 mmol/L (ref 0.5–2.0)
Lactic Acid, Venous: 1 mmol/L (ref 0.5–2.0)

## 2015-02-19 LAB — HIV 1/2 AB DIFFERENTIATION
HIV 1 Ab: POSITIVE — AB
HIV 2 Ab: NEGATIVE

## 2015-02-19 LAB — T-HELPER CELLS (CD4) COUNT (NOT AT ARMC)
CD4 % Helper T Cell: 13 % — ABNORMAL LOW (ref 33–55)
CD4 T Cell Abs: 60 /uL — ABNORMAL LOW (ref 400–2700)

## 2015-02-19 LAB — IRON AND TIBC
Iron: 83 ug/dL (ref 45–182)
SATURATION RATIOS: 38 % (ref 17.9–39.5)
TIBC: 220 ug/dL — ABNORMAL LOW (ref 250–450)
UIBC: 137 ug/dL

## 2015-02-19 LAB — VITAMIN B12: Vitamin B-12: 747 pg/mL (ref 180–914)

## 2015-02-19 LAB — FERRITIN: FERRITIN: 2079 ng/mL — AB (ref 24–336)

## 2015-02-19 LAB — FOLATE: FOLATE: 3.8 ng/mL — AB (ref >5.9)

## 2015-02-19 LAB — HIV ANTIBODY (ROUTINE TESTING W REFLEX)

## 2015-02-19 LAB — MRSA PCR SCREENING: MRSA BY PCR: NEGATIVE

## 2015-02-19 MED ORDER — DEXTROSE 5 % IV SOLN
10.0000 mg/kg | Freq: Three times a day (TID) | INTRAVENOUS | Status: DC
  Administered 2015-02-19 – 2015-02-20 (×2): 715 mg via INTRAVENOUS
  Filled 2015-02-19 (×7): qty 14.3

## 2015-02-19 MED ORDER — CHLORHEXIDINE GLUCONATE 0.12 % MT SOLN
15.0000 mL | Freq: Two times a day (BID) | OROMUCOSAL | Status: DC
  Administered 2015-02-19 – 2015-02-20 (×3): 15 mL via OROMUCOSAL
  Filled 2015-02-19 (×4): qty 15

## 2015-02-19 MED ORDER — WHITE PETROLATUM GEL
Status: AC
  Administered 2015-02-19: 0.2
  Filled 2015-02-19: qty 1

## 2015-02-19 MED ORDER — CETYLPYRIDINIUM CHLORIDE 0.05 % MT LIQD
7.0000 mL | Freq: Two times a day (BID) | OROMUCOSAL | Status: DC
  Administered 2015-02-19 (×2): 7 mL via OROMUCOSAL

## 2015-02-19 NOTE — Progress Notes (Signed)
[facility] PROGRESS NOTE  Victor [name] [name] DOB: [birth year] DOA: 02/18/2015 PCP: [name]  Brief Narrative: 29 y.o. male with no significant past medical history except for shingles of the left eye couple weeks prior to admission he was started on antiretroviral therapy and it improved. Most of the history is obtained from the mother asked the patient is not able to communicate. The mother relates that he had been having headache for the past 2 weeks, but 3 days prior to admission he was so lethargic that they couldn't communicate with him. Patient currently undergoing work up by neurology. On evaluation was found to have brain lesions and Neurosurgeon consulted.  Assessment/Plan: 1. Acute encephalopathy - Neurology assisting - work up underway - Pt on several anti infectives listed below. Will continue  2. Brain lesion - Neurosurgeon on board and recommending diagnostic biopsy as well as removal for decreasing mass effect.  3. Anemia - no active bleeding reported.  - Will obtain anemia panel  Code Status: full Family Communication: discussed with mother at bedside.  Disposition Plan: pending improvement in condition.   Consultants:  Neurosurgery  Neurology  Procedures:  none  Antibiotics:  Vancomycin  Acyclovir  Rocephin  HPI/Subjective: No new complaints.  Objective: Filed Vitals:   02/19/15 1100  BP:   Pulse:   Temp: 97.4 F (36.3 C)  Resp:     Intake/Output Summary (Last 24 hours) at 02/19/15 1402 Last data filed at 02/19/15 1100  Gross per 24 hour  Intake 1279.5 ml  Output    525 ml  Net  754.5 ml   Filed Weights   02/18/15 1055 02/18/15 2340  Weight: 86.183 kg (190 lb) 71.7 kg (158 lb 1.1 oz)    Exam:   General:  Pt resting comfortably, in Nad  Cardiovascular: rrr, no mrg  Respiratory: cta bl, no wheezes  Abdomen: soft, ND, NT  Musculoskeletal: no cyanosis or clubbing   Data Reviewed: Basic  Metabolic Panel:  Recent Labs Lab 02/18/15 1036  NA 136  K 4.1  CL 96*  CO2 28  GLUCOSE 118*  BUN 23*  CREATININE 1.02  CALCIUM 10.1   Liver Function Tests: No results for input(s): AST, ALT, ALKPHOS, BILITOT, PROT, ALBUMIN in the last 168 hours. No results for input(s): LIPASE, AMYLASE in the last 168 hours. No results for input(s): AMMONIA in the last 168 hours. CBC:  Recent Labs Lab 02/18/15 1036 02/19/15 1333  WBC 7.0 4.8  NEUTROABS 5.7  --   HGB 16.3 12.0*  HCT 48.1 35.3*  MCV 83.5 81.9  PLT 373 314   Cardiac Enzymes: No results for input(s): CKTOTAL, CKMB, CKMBINDEX, TROPONINI in the last 168 hours. BNP (last 3 results) No results for input(s): BNP in the last 8760 hours.  ProBNP (last 3 results) No results for input(s): PROBNP in the last 8760 hours.  CBG: No results for input(s): GLUCAP in the last 168 hours.  Recent Results (from the past 240 hour(s))  Culture, blood (routine x 2)     Status: None (Preliminary result)   Collection Time: 02/18/15 11:15 AM  Result Value Ref Range Status   Specimen Description BLOOD RIGHT ANTECUBITAL  Final   Special Requests BOTTLES DRAWN AEROBIC AND ANAEROBIC 5ML  Final   Culture  Setup Time   Final    GRAM POSITIVE COCCI IN CLUSTERS IN BOTH AEROBIC AND ANAEROBIC BOTTLES CRITICAL RESULT CALLED TO, READ BACK BY AND VERIFIED WITH: RRayburn Go AT [place] ON [id number] BY  S. Huey Performed at [facility]    Culture PENDING  Incomplete   Report Status PENDING  Incomplete  MRSA PCR Screening     Status: None   Collection Time: 02/19/15 12:02 AM  Result Value Ref Range Status   MRSA by PCR NEGATIVE NEGATIVE Final    Comment:        The GeneXpert MRSA Assay (FDA approved for NASAL specimens only), is one component of a comprehensive MRSA colonization surveillance program. It is not intended to diagnose MRSA infection nor to guide or monitor treatment for MRSA infections.      Studies: Ct Head Wo  Contrast  02/18/2015   CLINICAL DATA:  Neck pain, shingles  EXAM: CT HEAD WITHOUT CONTRAST  TECHNIQUE: Contiguous axial images were obtained from the base of the skull through the vertex without intravenous contrast.  COMPARISON:  None.  FINDINGS: No skull fracture is noted. There is no intracranial hemorrhage. There is heterogeneous mass in right basal ganglia just lateral to caudate no gross measures at least 3.5 cm. There is vasogenic edema in right temporal lobe and right frontal lobe. There is about 5 mm right to left midline shift at the level of the lesion. There is mass effect on right frontal horn. No intraventricular hemorrhage.  IMPRESSION: Nodular heterogeneous mass in right basal ganglia/ lateral to caudate nucleus measures at least 3.5 cm. Vasogenic edema in right frontal and right temporal lobe. Mass effect on right lateral ventricle. No intraventricular hemorrhage.  Further evaluation with unenhanced and enhanced MRI is recommended. These results were called by telephone at the time of interpretation on 02/18/2015 at 10:57 am to Dr. Montine Circle , who verbally acknowledged these results.   Electronically Signed   By: Lahoma Crocker M.D.   On: 02/18/2015 10:58   Ct Cervical Spine Wo Contrast  02/18/2015   CLINICAL DATA:  Injury post fall  EXAM: CT CERVICAL SPINE WITHOUT CONTRAST  TECHNIQUE: Multidetector CT imaging of the cervical spine was performed without intravenous contrast. Multiplanar CT image reconstructions were also generated.  COMPARISON:  None.  FINDINGS: Axial images of the cervical spine shows no acute fracture or subluxation. Computer processed images shows no acute fracture or subluxation. There is partial opacification of left mastoid air cells. No pneumothorax in visualized lung apices. Alignment, disc spaces and vertebral body heights are preserved.  IMPRESSION: No cervical spine acute fracture or subluxation. There is no pneumothorax in visualized lung apices. Partial opacification  of left mastoid air cells.   Electronically Signed   By: Lahoma Crocker M.D.   On: 02/18/2015 12:38   Mr Jeri Cos VH Contrast  02/18/2015   ADDENDUM REPORT: 02/18/2015 19:03  ADDENDUM: Study discussed by telephone with Dr. Olevia Bowens on 02/18/2015 at [place] hrs. In addition to the above clinical data, Dr. Olevia Bowens reports the patient has had 3 months of unexplained weight loss. The patient had been on antiviral medications for orbital shingles for 2 weeks. He also advised that he is screening the patient for HIV, which I think is prudent.   Electronically Signed   By: Genevie Ann M.D.   On: 02/18/2015 19:03   02/18/2015   CLINICAL DATA:  29 year old male is decreasing responsiveness and headaches for 3 days. 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 Right basal ganglia masslike abnormality on head CT earlier today. No fever in the emergency department. Initial encounter.  EXAM: MRI HEAD WITHOUT AND WITH CONTRAST  TECHNIQUE: Multiplanar, multiecho  pulse sequences of the brain and surrounding structures were obtained without and with intravenous contrast.  CONTRAST:  71mL MULTIHANCE GADOBENATE DIMEGLUMINE 529 MG/ML IV SOLN  COMPARISON:  Head CT without contrast 1046 hr today.  FINDINGS: Both caudate nuclei are abnormally enlarged and T2 heterogeneous, with mass like somewhat intermediate T2 signal, but the right side is much worse with diffuse basal ganglia involvement. There are some areas of restricted diffusion, but there is a petechial pattern of abnormal right basal ganglia enhancement, also with demonstration of mildly enlarged perforating vessels serving this region (series 7, image 12). There is also petechial abnormal hyper enhancement in the anterior right middle gyrus in a nodular cluster (series 14, image 37, with mild T2 and FLAIR hyperintensity here which is fairly contiguous with that about the right basal ganglia.  Furthermore, there is abnormal signal in  both medial thalami (same image), and abnormal T2 and FLAIR hyperintensity tracking from the midbrain into the periaqueductal gray matter and also with subtle nodularity and hyperintensity along the fourth ventricle (series 8, image 6). No associated enhancement or significant mass effect at these sites.  There is also diffuse T2 and FLAIR hyperintensity tracking from the right basal ganglia into the right temporal lobe, in a vasogenic edema type pattern. There are multiple dark T2 masslike areas in the right temporal lobe which are both peripheral (extending to the cortical surface) and at the gray-white junctions. As these rounded temporal lobe lesions range from 5 to 32 mm diameter and demonstrate mostly homogeneous enhancement and restricted diffusion.  There is intracranial mass effect with left to right midline shift up to 8 mm. There is effacement of the right lateral ventricle and cavum septum callosum. No ventriculomegaly. No debris within the ventricles. No dural thickening or meningeal enhancement. Basilar cisterns remain patent. No restricted diffusion in a pattern suggestive of acute infarction. Negative pituitary, cervicomedullary junction, and visualized cervical spine. No definite acute intracranial hemorrhage. Major intracranial vascular flow voids are preserved.  There are inflammatory changes in the paranasal sinuses and left mastoids. Intraorbital soft tissues appear within normal limits. Scalp soft tissues appear within normal limits. Overall bone marrow signal is within normal limits.  IMPRESSION: Markedly abnormal brain with multifocal mostly intermediate to low T2 signal masslike lesions with enhancement and edema. Right greater than left hemisphere involvement, also with nonenhancing signal abnormality tracking along the periaqueductal gray matter and fourth ventricle.  Top differential considerations are primary CNS lymphoma (favored) and multicentric glioblastoma. Infectious etiology  (severe encephalitis) is considered but felt much less likely.  Intracranial mass effect with leftward midline shift of 8 mm and ventricular effacement. Basilar cisterns remain patent. No ventriculomegaly.  Electronically Signed: By: Genevie Ann M.D. On: 02/18/2015 18:38    Scheduled Meds: . acyclovir  10 mg/kg Intravenous 3 times per day  . antiseptic oral rinse  7 mL Mouth Rinse q12n4p  . cefTRIAXone (ROCEPHIN)  IV  2 g Intravenous Q12H  . chlorhexidine  15 mL Mouth Rinse BID  . dexamethasone  4 mg Intravenous 4 times per day  . enoxaparin (LOVENOX) injection  40 mg Subcutaneous Q24H  . sodium chloride  3 mL Intravenous Q12H  . vancomycin  1,000 mg Intravenous Q8H   Continuous Infusions: . sodium chloride 75 mL/hr at 02/19/15 1000    Active Problems:   Acute encephalopathy   Brain mass   Vasogenic brain edema   Severe protein-calorie malnutrition   Fever    Time spent: > 35 minutes  Velvet Bathe  [facility] Pager [phone]. If 7PM-7AM, please contact night-coverage at [email or web address], password [name] 02/19/2015, 2:02 PM  LOS: 1 day

## 2015-02-19 NOTE — Progress Notes (Signed)
Patient ID: Victor [name], male   DOB: Mar 10, 1986, 29 y.o.   MRN: [id number] Plan is for surgery tomorrow. Spoke to family about risks and benefits. They wish for [place] to proceed. Will perform crani tomorrow.

## 2015-02-19 NOTE — Care Management Note (Signed)
Case Management Note  Patient Details  Name: Sirron Francesconi MRN: [id number] Date of Birth: 30-Mar-1986  Subjective/Objective:        Admitted with AMS            Action/Plan: Return to home when medically stable. CM to f/u with disposition needs.  Expected Discharge Date:   (unknown)               Expected Discharge Plan:  [place]  In-House Referral:     Discharge planning Services  CM Consult  Post Acute Care Choice:    Choice offered to:     DME Arranged:    DME Agency:     HH Arranged:    HH Agency:     Status of Service:  In process, will continue to follow  Medicare Important Message Given:    Date Medicare IM Given:    Medicare IM give by:    Date Additional Medicare IM Given:    Additional Medicare Important Message give by:     If discussed at [place] of Stay Meetings, dates discussed:    Additional Comments:   02/19/15 @ 1545 CM spoke with financial counselor regarding pt with no insurance. Counselor(Ashley) @ [phone] stated will meet with mom @ 0900 in pt's room. CM made mom aware.  Whitman Hero RN,CM  Albie Arizpe (Mother)  [phone]  Sharin Mons, [place] [phone] 02/19/2015, 12:43 PM

## 2015-02-19 NOTE — Consult Note (Signed)
Reason for Consult:Brain lesions Referring Physician: Hospitalists  Victor [name] is an 29 y.o. male.  HPI: 29 yo male with 3 to 4 week history of headaches. He was diagnosed with ophthalmic herpes, and started on oral anti viral agents. Over the last few days he was noted to have worsening mental status, and was brought to [facility] yesterday where evaluated with Ct and MRI of the brain. This showed numerous abnormalities, particularly a few enhancing lesions in the right temporal lobe with mass effect. Neurosurgical consult requested.  Past Medical History  Diagnosis Date  . Dental abscess 02/15/2012    History reviewed. No pertinent past surgical history.  History reviewed. No pertinent family history.  Social History:  reports that he has quit smoking. His smoking use included Cigarettes. He has never used smokeless tobacco. He reports that he drinks alcohol. He reports that he does not use illicit drugs.  Allergies:  Allergies  Allergen Reactions  . Shrimp [Shellfish Allergy] Swelling  . Fish Allergy Swelling    Medications: I have reviewed the patient's current medications.  Results for orders placed or performed during the hospital encounter of 02/18/15 (from the past 48 hour(s))  CBC with Differential/Platelet     Status: Abnormal   Collection Time: 02/18/15 10:36 AM  Result Value Ref Range   WBC 7.0 4.0 - 10.5 K/uL   RBC 5.76 4.22 - 5.81 MIL/uL   Hemoglobin 16.3 13.0 - 17.0 g/dL   HCT 48.1 39.0 - 52.0 %   MCV 83.5 78.0 - 100.0 fL   MCH 28.3 26.0 - 34.0 pg   MCHC 33.9 30.0 - 36.0 g/dL   RDW 13.6 11.5 - 15.5 %   Platelets 373 150 - 400 K/uL   Neutrophils Relative % 83 (H) 43 - 77 %   Neutro Abs 5.7 1.7 - 7.7 K/uL   Lymphocytes Relative 6 (L) 12 - 46 %   Lymphs Abs 0.4 (L) 0.7 - 4.0 K/uL   Monocytes Relative 11 3 - 12 %   Monocytes Absolute 0.8 0.1 - 1.0 K/uL   Eosinophils Relative 0 0 - 5 %   Eosinophils Absolute 0.0 0.0 - 0.7 K/uL   Basophils Relative 0 0 -  1 %   Basophils Absolute 0.0 0.0 - 0.1 K/uL  Basic metabolic panel     Status: Abnormal   Collection Time: 02/18/15 10:36 AM  Result Value Ref Range   Sodium 136 135 - 145 mmol/L   Potassium 4.1 3.5 - 5.1 mmol/L   Chloride 96 (L) 101 - 111 mmol/L   CO2 28 22 - 32 mmol/L   Glucose, Bld 118 (H) 65 - 99 mg/dL   BUN 23 (H) 6 - 20 mg/dL   Creatinine, Ser 1.02 0.61 - 1.24 mg/dL   Calcium 10.1 8.9 - 10.3 mg/dL   GFR calc non Af Amer >60 >60 mL/min   GFR calc Af Amer >60 >60 mL/min    Comment: (NOTE) The eGFR has been calculated using the CKD EPI equation. This calculation has not been validated in all clinical situations. eGFR's persistently <60 mL/min signify possible Chronic Kidney Disease.    Anion gap 12 5 - 15  Ethanol     Status: None   Collection Time: 02/18/15 10:36 AM  Result Value Ref Range   Alcohol, Ethyl (B) <5 <5 mg/dL    Comment:        LOWEST DETECTABLE LIMIT FOR SERUM ALCOHOL IS 5 mg/dL FOR MEDICAL PURPOSES ONLY   Salicylate level  Status: None   Collection Time: 02/18/15 10:36 AM  Result Value Ref Range   Salicylate Lvl <2.6 2.8 - 30.0 mg/dL  Acetaminophen level     Status: Abnormal   Collection Time: 02/18/15 10:36 AM  Result Value Ref Range   Acetaminophen (Tylenol), Serum <10 (L) 10 - 30 ug/mL    Comment:        THERAPEUTIC CONCENTRATIONS VARY SIGNIFICANTLY. A RANGE OF 10-30 ug/mL MAY BE AN EFFECTIVE CONCENTRATION FOR MANY PATIENTS. HOWEVER, SOME ARE BEST TREATED AT CONCENTRATIONS OUTSIDE THIS RANGE. ACETAMINOPHEN CONCENTRATIONS >150 ug/mL AT 4 HOURS AFTER INGESTION AND >50 ug/mL AT 12 HOURS AFTER INGESTION ARE OFTEN ASSOCIATED WITH TOXIC REACTIONS.   I-Stat CG4 Lactic Acid, ED     Status: Abnormal   Collection Time: 02/18/15 10:41 AM  Result Value Ref Range   Lactic Acid, Venous 2.91 (HH) 0.5 - 2.0 mmol/L   Comment NOTIFIED PHYSICIAN   Culture, blood (routine x 2)     Status: None (Preliminary result)   Collection Time: 02/18/15 11:15 AM   Result Value Ref Range   Specimen Description BLOOD RIGHT ANTECUBITAL    Special Requests BOTTLES DRAWN AEROBIC AND ANAEROBIC 5ML    Culture  Setup Time      GRAM POSITIVE COCCI IN CLUSTERS AEROBIC BOTTLE ONLY CRITICAL RESULT CALLED TO, READ BACK BY AND VERIFIED WITH: Lamont Dowdy AT [place] ON [id number] BY Rhea Bleacher Performed at [facility]    Culture PENDING    Report Status PENDING   Urine rapid drug screen (hosp performed)     Status: Abnormal   Collection Time: 02/18/15 11:22 AM  Result Value Ref Range   Opiates NONE DETECTED NONE DETECTED   Cocaine NONE DETECTED NONE DETECTED   Benzodiazepines NONE DETECTED NONE DETECTED   Amphetamines NONE DETECTED NONE DETECTED   Tetrahydrocannabinol POSITIVE (A) NONE DETECTED   Barbiturates NONE DETECTED NONE DETECTED    Comment:        DRUG SCREEN FOR MEDICAL PURPOSES ONLY.  IF CONFIRMATION IS NEEDED FOR ANY PURPOSE, NOTIFY LAB WITHIN 5 DAYS.        LOWEST DETECTABLE LIMITS FOR URINE DRUG SCREEN Drug Class       Cutoff (ng/mL) Amphetamine      1000 Barbiturate      200 Benzodiazepine   270 Tricyclics       350 Opiates          300 Cocaine          300 THC              50   Lactic acid, plasma     Status: None   Collection Time: 02/18/15  3:00 PM  Result Value Ref Range   Lactic Acid, Venous 1.2 0.5 - 2.0 mmol/L  APTT     Status: None   Collection Time: 02/18/15  3:02 PM  Result Value Ref Range   aPTT 36 24 - 37 seconds  Protime-INR     Status: Abnormal   Collection Time: 02/18/15  3:02 PM  Result Value Ref Range   Prothrombin Time 15.8 (H) 11.6 - 15.2 seconds   INR 1.24 0.00 - 1.49  MRSA PCR Screening     Status: None   Collection Time: 02/19/15 12:02 AM  Result Value Ref Range   MRSA by PCR NEGATIVE NEGATIVE    Comment:        The GeneXpert MRSA Assay (FDA approved for NASAL specimens only), is one component of a comprehensive  MRSA colonization surveillance program. It is not intended to diagnose  MRSA infection nor to guide or monitor treatment for MRSA infections.     Ct Head Wo Contrast  02/18/2015   CLINICAL DATA:  Neck pain, shingles  EXAM: CT HEAD WITHOUT CONTRAST  TECHNIQUE: Contiguous axial images were obtained from the base of the skull through the vertex without intravenous contrast.  COMPARISON:  None.  FINDINGS: No skull fracture is noted. There is no intracranial hemorrhage. There is heterogeneous mass in right basal ganglia just lateral to caudate no gross measures at least 3.5 cm. There is vasogenic edema in right temporal lobe and right frontal lobe. There is about 5 mm right to left midline shift at the level of the lesion. There is mass effect on right frontal horn. No intraventricular hemorrhage.  IMPRESSION: Nodular heterogeneous mass in right basal ganglia/ lateral to caudate nucleus measures at least 3.5 cm. Vasogenic edema in right frontal and right temporal lobe. Mass effect on right lateral ventricle. No intraventricular hemorrhage.  Further evaluation with unenhanced and enhanced MRI is recommended. These results were called by telephone at the time of interpretation on 02/18/2015 at 10:57 am to Dr. Montine Circle , who verbally acknowledged these results.   Electronically Signed   By: Lahoma Crocker M.D.   On: 02/18/2015 10:58   Ct Cervical Spine Wo Contrast  02/18/2015   CLINICAL DATA:  Injury post fall  EXAM: CT CERVICAL SPINE WITHOUT CONTRAST  TECHNIQUE: Multidetector CT imaging of the cervical spine was performed without intravenous contrast. Multiplanar CT image reconstructions were also generated.  COMPARISON:  None.  FINDINGS: Axial images of the cervical spine shows no acute fracture or subluxation. Computer processed images shows no acute fracture or subluxation. There is partial opacification of left mastoid air cells. No pneumothorax in visualized lung apices. Alignment, disc spaces and vertebral body heights are preserved.  IMPRESSION: No cervical spine acute  fracture or subluxation. There is no pneumothorax in visualized lung apices. Partial opacification of left mastoid air cells.   Electronically Signed   By: Lahoma Crocker M.D.   On: 02/18/2015 12:38   Mr Jeri Cos YK Contrast  02/18/2015   ADDENDUM REPORT: 02/18/2015 19:03  ADDENDUM: Study discussed by telephone with Dr. Olevia Bowens on 02/18/2015 at [place] hrs. In addition to the above clinical data, Dr. Olevia Bowens reports the patient has had 3 months of unexplained weight loss. The patient had been on antiviral medications for orbital shingles for 2 weeks. He also advised that he is screening the patient for HIV, which I think is prudent.   Electronically Signed   By: Genevie Ann M.D.   On: 02/18/2015 19:03   02/18/2015   CLINICAL DATA:  29 year old male is decreasing responsiveness and headaches for 3 days. 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 Right basal ganglia masslike abnormality on head CT earlier today. No fever in the emergency department. Initial encounter.  EXAM: MRI HEAD WITHOUT AND WITH CONTRAST  TECHNIQUE: Multiplanar, multiecho pulse sequences of the brain and surrounding structures were obtained without and with intravenous contrast.  CONTRAST:  50m MULTIHANCE GADOBENATE DIMEGLUMINE 529 MG/ML IV SOLN  COMPARISON:  Head CT without contrast 1046 hr today.  FINDINGS: Both caudate nuclei are abnormally enlarged and T2 heterogeneous, with mass like somewhat intermediate T2 signal, but the right side is much worse with diffuse basal ganglia involvement. There are some areas of restricted diffusion, but there is a petechial pattern of abnormal  right basal ganglia enhancement, also with demonstration of mildly enlarged perforating vessels serving this region (series 7, image 12). There is also petechial abnormal hyper enhancement in the anterior right middle gyrus in a nodular cluster (series 14, image 37, with mild T2 and FLAIR hyperintensity here which is  fairly contiguous with that about the right basal ganglia.  Furthermore, there is abnormal signal in both medial thalami (same image), and abnormal T2 and FLAIR hyperintensity tracking from the midbrain into the periaqueductal gray matter and also with subtle nodularity and hyperintensity along the fourth ventricle (series 8, image 6). No associated enhancement or significant mass effect at these sites.  There is also diffuse T2 and FLAIR hyperintensity tracking from the right basal ganglia into the right temporal lobe, in a vasogenic edema type pattern. There are multiple dark T2 masslike areas in the right temporal lobe which are both peripheral (extending to the cortical surface) and at the gray-white junctions. As these rounded temporal lobe lesions range from 5 to 32 mm diameter and demonstrate mostly homogeneous enhancement and restricted diffusion.  There is intracranial mass effect with left to right midline shift up to 8 mm. There is effacement of the right lateral ventricle and cavum septum callosum. No ventriculomegaly. No debris within the ventricles. No dural thickening or meningeal enhancement. Basilar cisterns remain patent. No restricted diffusion in a pattern suggestive of acute infarction. Negative pituitary, cervicomedullary junction, and visualized cervical spine. No definite acute intracranial hemorrhage. Major intracranial vascular flow voids are preserved.  There are inflammatory changes in the paranasal sinuses and left mastoids. Intraorbital soft tissues appear within normal limits. Scalp soft tissues appear within normal limits. Overall bone marrow signal is within normal limits.  IMPRESSION: Markedly abnormal brain with multifocal mostly intermediate to low T2 signal masslike lesions with enhancement and edema. Right greater than left hemisphere involvement, also with nonenhancing signal abnormality tracking along the periaqueductal gray matter and fourth ventricle.  Top differential  considerations are primary CNS lymphoma (favored) and multicentric glioblastoma. Infectious etiology (severe encephalitis) is considered but felt much less likely.  Intracranial mass effect with leftward midline shift of 8 mm and ventricular effacement. Basilar cisterns remain patent. No ventriculomegaly.  Electronically Signed: By: Genevie Ann M.D. On: 02/18/2015 18:38    Review of systems not obtained due to patient factors. Blood pressure 113/82, pulse 82, temperature 98.5 F (36.9 C), temperature source Oral, resp. rate 23, height 6' 3"  (1.905 m), weight 71.7 kg (158 lb 1.1 oz), SpO2 98 %. Patient is lethargic, but arousable. He will answer a few questions with yes no responses. He does follow a few simple commands.   Assessment/Plan: MRI reviewed and does show multiple lesions in the right temporal lobe as well as abnormal speckling in the basal ganglia on the right. There is right to left shift. I am not really sure what this issue is. He does have mass lesions as well as a more diffuse process. Could be lymphoma. He basically needs diagnostic biopsy as well as removal for decreasing mass effect. Will discuss and plan surgical intervention in next day or so.  Faythe Ghee, MD 02/19/2015, 10:49 AM

## 2015-02-19 NOTE — Progress Notes (Signed)
Subjective: Mental status has markedly improved. Patient was alert and conversant and had no complaints.  Objective: Current vital signs: BP 113/82 mmHg  Pulse 82  Temp(Src) 98.1 F (36.7 C) (Oral)  Resp 23  Ht 6\' 3"  (1.905 m)  Wt 71.7 kg (158 lb 1.1 oz)  BMI 19.76 kg/m2  SpO2 98%  Neurologic Exam: Patient was oriented to person and time, but oriented to place (though he was in [place]). No expressive nor receptive aphasia noted. Pupils were equal and reacted normally to light. Extraocular movements were full and conjugate. Mild left lower facial weakness was noted. Muscle tone was normal throughout, as well as muscle strength. Deep tendon reflexes were 1+ and symmetrical. Plantar responses were mute bilaterally. There was reduced perception to tactile sensation over extremities compared to right extremities.  Medications: I have reviewed the patient's current medications.  Assessment/Plan: 29 year old man with right temporal mass lesions and initiated vasogenic edema with mass effect with midline shift. Patient's mental status has markedly improved since Decadron was started. He still has mild confusion, however. He has been evaluated by neurosurgery, Dr. Hal Neer. Surgical intervention for diagnostic biopsy as well as removal or decreasing mass lesion recommended.  Recommendations: Recommend no changes in current management including continuing Decadron for management of vasogenic brain edema, as well as continuing broad-spectrum anabiotic coverage and anti-viral coverage with acyclovir.  We will continue to follow this patient with you.  C.R. Nicole Kindred, MD [facility] [phone]  02/19/2015  3:45 PM

## 2015-02-19 NOTE — Progress Notes (Signed)
          [facility]     NOTE I WAS ALERTED TO THIS PATIENT'S PRESENCE BY HIS POSITIVE HIV TEST  GIVEN HIS HIV + TEST AND BRAIN MASS DIFFERENTIAL INCLUDES   PRIMARY CNS LYMPHOMA  TOXOPLASMOSIS  CRYPTOCOCCAL INFECTION OF CNS  Possibly another indolent infection such as Tuberculoma, histoplasma, blastomyces infection.  I support diagnostic NEUROSURGICAL intervention and would be sure to send material  FOR   PATHOLOGY WITH SPECIAL STAINS FOR MICRO-ORGANISMS  ALSO TISSUE  FOR FUNGAL STAIN AND CULTURE  TISSUE FOR AFB STAIN AND CULTURE  I VERY MUCH DOUBT THIS IS A PYOGENIC PROCESS AND THAT PT WOULD NEED ROCEPHIN OR VANCOMYCIN  I WILL ORDER HIV ULTRA QUANT, GENOTYPE CD4, TOXOPLASMA ABS, CRYPTO AG SERUM, RPR WITH [facility] AND SEE THE PATIENT FORMALLY TOMORROW

## 2015-02-20 ENCOUNTER — Inpatient Hospital Stay (HOSPITAL_COMMUNITY): Payer: Medicaid Other | Admitting: Anesthesiology

## 2015-02-20 ENCOUNTER — Encounter (HOSPITAL_COMMUNITY): Payer: Self-pay | Admitting: Anesthesiology

## 2015-02-20 ENCOUNTER — Inpatient Hospital Stay (HOSPITAL_COMMUNITY): Payer: Medicaid Other

## 2015-02-20 ENCOUNTER — Encounter (HOSPITAL_COMMUNITY)
Admission: EM | Disposition: A | Discharge status: Other Acute Inpatient Hospital | Payer: Self-pay | Source: Home / Self Care | Attending: Internal Medicine

## 2015-02-20 DIAGNOSIS — C719 Malignant neoplasm of brain, unspecified: Secondary | ICD-10-CM

## 2015-02-20 DIAGNOSIS — D496 Neoplasm of unspecified behavior of brain: Secondary | ICD-10-CM | POA: Insufficient documentation

## 2015-02-20 DIAGNOSIS — B20 Human immunodeficiency virus [HIV] disease: Secondary | ICD-10-CM

## 2015-02-20 DIAGNOSIS — Z9911 Dependence on respirator [ventilator] status: Secondary | ICD-10-CM

## 2015-02-20 DIAGNOSIS — J9601 Acute respiratory failure with hypoxia: Secondary | ICD-10-CM

## 2015-02-20 DIAGNOSIS — J9690 Respiratory failure, unspecified, unspecified whether with hypoxia or hypercapnia: Secondary | ICD-10-CM | POA: Insufficient documentation

## 2015-02-20 HISTORY — PX: CRANIOTOMY: SHX93

## 2015-02-20 HISTORY — DX: Human immunodeficiency virus (HIV) disease: B20

## 2015-02-20 LAB — VANCOMYCIN, TROUGH: Vancomycin Tr: 12 ug/mL (ref 10.0–20.0)

## 2015-02-20 LAB — BASIC METABOLIC PANEL
ANION GAP: 9 (ref 5–15)
BUN: 10 mg/dL (ref 6–20)
CO2: 22 mmol/L (ref 22–32)
Calcium: 8.7 mg/dL — ABNORMAL LOW (ref 8.9–10.3)
Chloride: 102 mmol/L (ref 101–111)
Creatinine, Ser: 0.74 mg/dL (ref 0.61–1.24)
GFR calc Af Amer: >60 mL/min (ref >60)
GLUCOSE: 122 mg/dL — AB (ref 65–99)
POTASSIUM: 3.7 mmol/L (ref 3.5–5.1)
SODIUM: 133 mmol/L — AB (ref 135–145)

## 2015-02-20 LAB — CBC
HCT: 32.9 % — ABNORMAL LOW (ref 39.0–52.0)
HCT: 34.9 % — ABNORMAL LOW (ref 39.0–52.0)
Hemoglobin: 11.1 g/dL — ABNORMAL LOW (ref 13.0–17.0)
Hemoglobin: 11.6 g/dL — ABNORMAL LOW (ref 13.0–17.0)
MCH: 27.9 pg (ref 26.0–34.0)
MCH: 27.5 pg (ref 26.0–34.0)
MCHC: 33.7 g/dL (ref 30.0–36.0)
MCHC: 33.2 g/dL (ref 30.0–36.0)
MCV: 82.7 fL (ref 78.0–100.0)
MCV: 82.7 fL (ref 78.0–100.0)
PLATELETS: 349 10*3/uL (ref 150–400)
Platelets: 311 10*3/uL (ref 150–400)
RBC: 3.98 MIL/uL — ABNORMAL LOW (ref 4.22–5.81)
RBC: 4.22 MIL/uL (ref 4.22–5.81)
RDW: 13.6 % (ref 11.5–15.5)
RDW: 13.8 % (ref 11.5–15.5)
WBC: 6.2 10*3/uL (ref 4.0–10.5)
WBC: 4.7 10*3/uL (ref 4.0–10.5)

## 2015-02-20 LAB — CRYPTOCOCCAL ANTIGEN: Crypto Ag: NEGATIVE

## 2015-02-20 SURGERY — CRANIOTOMY TUMOR EXCISION
Anesthesia: General | Site: Head | Laterality: Right

## 2015-02-20 MED ORDER — PROPOFOL 1000 MG/100ML IV EMUL
5.0000 ug/kg/min | INTRAVENOUS | Status: DC
  Administered 2015-02-21: 20 ug/kg/min via INTRAVENOUS
  Filled 2015-02-20: qty 100

## 2015-02-20 MED ORDER — PROPOFOL 10 MG/ML IV BOLUS
INTRAVENOUS | Status: DC | PRN
  Administered 2015-02-20: 80 mg via INTRAVENOUS
  Administered 2015-02-20: 100 mg via INTRAVENOUS
  Administered 2015-02-20: 20 mg via INTRAVENOUS
  Administered 2015-02-20 (×2): 30 mg via INTRAVENOUS
  Administered 2015-02-20: 20 mg via INTRAVENOUS

## 2015-02-20 MED ORDER — ONDANSETRON HCL 4 MG/2ML IJ SOLN
INTRAMUSCULAR | Status: DC | PRN
  Administered 2015-02-20: 4 mg via INTRAVENOUS

## 2015-02-20 MED ORDER — SODIUM CHLORIDE 0.9 % IR SOLN
Status: DC | PRN
  Administered 2015-02-20 (×2): 1000 mL

## 2015-02-20 MED ORDER — SULFAMETHOXAZOLE-TRIMETHOPRIM 200-40 MG/5ML PO SUSP
20.0000 mL | Freq: Every day | ORAL | Status: DC
  Administered 2015-02-21 – 2015-02-23 (×3): 20 mL
  Filled 2015-02-20 (×5): qty 20

## 2015-02-20 MED ORDER — SUCCINYLCHOLINE CHLORIDE 20 MG/ML IJ SOLN
INTRAMUSCULAR | Status: DC | PRN
  Administered 2015-02-20: 100 mg via INTRAVENOUS

## 2015-02-20 MED ORDER — DOLUTEGRAVIR SODIUM 50 MG PO TABS
50.0000 mg | ORAL_TABLET | Freq: Every day | ORAL | Status: DC
  Administered 2015-02-21 – 2015-02-27 (×6): 50 mg via ORAL
  Filled 2015-02-20 (×7): qty 1

## 2015-02-20 MED ORDER — SODIUM CHLORIDE 0.9 % IV SOLN
500.0000 mg | Freq: Two times a day (BID) | INTRAVENOUS | Status: DC
  Administered 2015-02-20 – 2015-02-27 (×15): 500 mg via INTRAVENOUS
  Filled 2015-02-20 (×17): qty 5

## 2015-02-20 MED ORDER — CHLORHEXIDINE GLUCONATE 0.12% ORAL RINSE (MEDLINE KIT)
15.0000 mL | Freq: Two times a day (BID) | OROMUCOSAL | Status: DC
  Administered 2015-02-20 – 2015-02-23 (×6): 15 mL via OROMUCOSAL

## 2015-02-20 MED ORDER — MICROFIBRILLAR COLL HEMOSTAT EX PADS
MEDICATED_PAD | CUTANEOUS | Status: DC | PRN
  Administered 2015-02-20: 1 via TOPICAL

## 2015-02-20 MED ORDER — PROPOFOL 10 MG/ML IV BOLUS
INTRAVENOUS | Status: AC
  Filled 2015-02-20: qty 20

## 2015-02-20 MED ORDER — ONDANSETRON HCL 4 MG PO TABS
4.0000 mg | ORAL_TABLET | ORAL | Status: DC | PRN
Start: 2015-02-20 — End: 2015-02-22

## 2015-02-20 MED ORDER — PROMETHAZINE HCL 25 MG/ML IJ SOLN: 6.2500 mg | INTRAMUSCULAR | Status: DC | PRN

## 2015-02-20 MED ORDER — GLYCOPYRROLATE 0.2 MG/ML IJ SOLN
INTRAMUSCULAR | Status: AC
  Filled 2015-02-20: qty 2

## 2015-02-20 MED ORDER — LABETALOL HCL 5 MG/ML IV SOLN: 10.0000 mg | INTRAVENOUS | Status: DC | PRN

## 2015-02-20 MED ORDER — DEXAMETHASONE SODIUM PHOSPHATE 4 MG/ML IJ SOLN
4.0000 mg | Freq: Three times a day (TID) | INTRAMUSCULAR | Status: DC
Start: 2015-02-22 — End: 2015-02-23
  Administered 2015-02-22 – 2015-02-23 (×2): 4 mg via INTRAVENOUS
  Filled 2015-02-20 (×5): qty 1

## 2015-02-20 MED ORDER — KCL IN DEXTROSE-NACL 10-5-0.45 MEQ/L-%-% IV SOLN
INTRAVENOUS | Status: DC
  Administered 2015-02-20: 16:00:00 via INTRAVENOUS
  Administered 2015-02-21: 1000 mL via INTRAVENOUS
  Administered 2015-02-21 – 2015-02-22 (×2): via INTRAVENOUS
  Filled 2015-02-20 (×12): qty 1000

## 2015-02-20 MED ORDER — EMTRICITABINE-TENOFOVIR DF 200-300 MG PO TABS
1.0000 | ORAL_TABLET | Freq: Every day | ORAL | Status: DC
  Administered 2015-02-21 – 2015-02-27 (×6): 1
  Filled 2015-02-20 (×7): qty 1

## 2015-02-20 MED ORDER — VANCOMYCIN HCL IN DEXTROSE 1-5 GM/200ML-% IV SOLN
INTRAVENOUS | Status: AC
  Filled 2015-02-20: qty 200

## 2015-02-20 MED ORDER — THROMBIN 20000 UNITS EX SOLR
CUTANEOUS | Status: DC | PRN
  Administered 2015-02-20: 20 mL via TOPICAL

## 2015-02-20 MED ORDER — PROMETHAZINE HCL 25 MG PO TABS: 12.5000 mg | ORAL_TABLET | ORAL | Status: DC | PRN

## 2015-02-20 MED ORDER — DEXAMETHASONE SODIUM PHOSPHATE 4 MG/ML IJ SOLN
4.0000 mg | Freq: Four times a day (QID) | INTRAMUSCULAR | Status: AC
  Administered 2015-02-21 – 2015-02-22 (×4): 4 mg via INTRAVENOUS
  Filled 2015-02-20 (×4): qty 1

## 2015-02-20 MED ORDER — ROCURONIUM BROMIDE 50 MG/5ML IV SOLN
INTRAVENOUS | Status: AC
  Filled 2015-02-20: qty 1

## 2015-02-20 MED ORDER — SODIUM CHLORIDE 0.9 % IR SOLN
Status: DC | PRN
  Administered 2015-02-20: 500 mL

## 2015-02-20 MED ORDER — PROPOFOL 1000 MG/100ML IV EMUL
INTRAVENOUS | Status: AC
  Administered 2015-02-20: 1000 mg
  Filled 2015-02-20: qty 100

## 2015-02-20 MED ORDER — HYDROMORPHONE HCL 1 MG/ML IJ SOLN: 0.2500 mg | INTRAMUSCULAR | Status: DC | PRN

## 2015-02-20 MED ORDER — DEXAMETHASONE SODIUM PHOSPHATE 10 MG/ML IJ SOLN
6.0000 mg | Freq: Four times a day (QID) | INTRAMUSCULAR | Status: AC
  Administered 2015-02-20 – 2015-02-21 (×4): 6 mg via INTRAVENOUS
  Filled 2015-02-20 (×2): qty 0.6
  Filled 2015-02-20 (×2): qty 1

## 2015-02-20 MED ORDER — PANTOPRAZOLE SODIUM 40 MG IV SOLR
40.0000 mg | Freq: Every day | INTRAVENOUS | Status: DC
  Administered 2015-02-20 – 2015-02-27 (×8): 40 mg via INTRAVENOUS
  Filled 2015-02-20 (×9): qty 40

## 2015-02-20 MED ORDER — LIDOCAINE HCL (CARDIAC) 20 MG/ML IV SOLN
INTRAVENOUS | Status: AC
  Filled 2015-02-20: qty 5

## 2015-02-20 MED ORDER — ONDANSETRON HCL 4 MG/2ML IJ SOLN: 4.0000 mg | INTRAMUSCULAR | Status: DC | PRN

## 2015-02-20 MED ORDER — ROCURONIUM BROMIDE 100 MG/10ML IV SOLN
INTRAVENOUS | Status: DC | PRN
  Administered 2015-02-20: 10 mg via INTRAVENOUS
  Administered 2015-02-20: 30 mg via INTRAVENOUS
  Administered 2015-02-20: 10 mg via INTRAVENOUS

## 2015-02-20 MED ORDER — MANNITOL 25 % IV SOLN
INTRAVENOUS | Status: DC | PRN
  Administered 2015-02-20: 50 g via INTRAVENOUS

## 2015-02-20 MED ORDER — SODIUM CHLORIDE 0.9 % IV SOLN
INTRAVENOUS | Status: DC | PRN
  Administered 2015-02-20: 13:00:00 via INTRAVENOUS

## 2015-02-20 MED ORDER — FENTANYL CITRATE (PF) 100 MCG/2ML IJ SOLN
INTRAMUSCULAR | Status: DC | PRN
  Administered 2015-02-20 (×3): 50 ug via INTRAVENOUS

## 2015-02-20 MED ORDER — FENTANYL CITRATE (PF) 250 MCG/5ML IJ SOLN
INTRAMUSCULAR | Status: AC
  Filled 2015-02-20: qty 5

## 2015-02-20 MED ORDER — LIDOCAINE HCL (PF) 1 % IJ SOLN
INTRAMUSCULAR | Status: DC | PRN
  Administered 2015-02-20: 15 mL

## 2015-02-20 MED ORDER — NALOXONE HCL 0.4 MG/ML IJ SOLN: 0.0800 mg | INTRAMUSCULAR | Status: DC | PRN

## 2015-02-20 MED ORDER — NEOSTIGMINE METHYLSULFATE 10 MG/10ML IV SOLN
INTRAVENOUS | Status: DC | PRN
  Administered 2015-02-20: 3 mg via INTRAVENOUS

## 2015-02-20 MED ORDER — VANCOMYCIN HCL 10 G IV SOLR
1250.0000 mg | Freq: Three times a day (TID) | INTRAVENOUS | Status: DC
  Filled 2015-02-20 (×2): qty 1250

## 2015-02-20 MED ORDER — ANTISEPTIC ORAL RINSE SOLUTION (CORINZ)
7.0000 mL | Freq: Four times a day (QID) | OROMUCOSAL | Status: DC
  Administered 2015-02-20 – 2015-02-23 (×12): 7 mL via OROMUCOSAL

## 2015-02-20 MED ORDER — MORPHINE SULFATE 2 MG/ML IJ SOLN: 1.0000 mg | INTRAMUSCULAR | Status: DC | PRN

## 2015-02-20 MED ORDER — GLYCOPYRROLATE 0.2 MG/ML IJ SOLN
INTRAMUSCULAR | Status: DC | PRN
  Administered 2015-02-20: 0.4 mg via INTRAVENOUS

## 2015-02-20 MED ORDER — NEOSTIGMINE METHYLSULFATE 10 MG/10ML IV SOLN
INTRAVENOUS | Status: AC
  Filled 2015-02-20: qty 1

## 2015-02-20 SURGICAL SUPPLY — 69 items
BAG DECANTER FOR FLEXI CONT (MISCELLANEOUS) ×3 IMPLANT
BANDAGE GAUZE 4  KLING STR (GAUZE/BANDAGES/DRESSINGS) ×6 IMPLANT
BLADE CLIPPER SURG (BLADE) ×3 IMPLANT
BNDG GAUZE ELAST 4 BULKY (GAUZE/BANDAGES/DRESSINGS) ×6 IMPLANT
BRUSH SCRUB EZ 1% IODOPHOR (MISCELLANEOUS) ×3 IMPLANT
BRUSH SCRUB EZ PLAIN DRY (MISCELLANEOUS) ×3 IMPLANT
BUR SPIRAL ROUTER 2.3 (BUR) IMPLANT
BUR SPIRAL ROUTER 2.3MM (BUR)
CANISTER SUCT 3000ML PPV (MISCELLANEOUS) ×6 IMPLANT
CLIP TI MEDIUM 6 (CLIP) ×3 IMPLANT
CONT SPEC 4OZ CLIKSEAL STRL BL (MISCELLANEOUS) ×3 IMPLANT
DRAIN SNY WOU 7FLT (WOUND CARE) IMPLANT
DRAPE MICROSCOPE LEICA (MISCELLANEOUS) ×3 IMPLANT
DRAPE NEUROLOGICAL W/INCISE (DRAPES) ×3 IMPLANT
DRAPE SURG 17X23 STRL (DRAPES) ×6 IMPLANT
DRAPE WARM FLUID 44X44 (DRAPE) ×3 IMPLANT
DRSG TELFA 3X8 NADH (GAUZE/BANDAGES/DRESSINGS) ×3 IMPLANT
ELECT CAUTERY BLADE 6.4 (BLADE) ×3 IMPLANT
ELECT REM PT RETURN 9FT ADLT (ELECTROSURGICAL) ×3
ELECTRODE REM PT RTRN 9FT ADLT (ELECTROSURGICAL) ×1 IMPLANT
EVACUATOR 1/8 PVC DRAIN (DRAIN) IMPLANT
EVACUATOR SILICONE 100CC (DRAIN) IMPLANT
GAUZE SPONGE 4X4 12PLY STRL (GAUZE/BANDAGES/DRESSINGS) ×3 IMPLANT
GAUZE SPONGE 4X4 16PLY XRAY LF (GAUZE/BANDAGES/DRESSINGS) IMPLANT
GLOVE ECLIPSE 8.0 STRL XLNG CF (GLOVE) ×3 IMPLANT
GLOVE EXAM NITRILE LRG STRL (GLOVE) IMPLANT
GLOVE EXAM NITRILE XL STR (GLOVE) IMPLANT
GLOVE EXAM NITRILE XS STR PU (GLOVE) IMPLANT
GOWN STRL REUS W/ TWL LRG LVL3 (GOWN DISPOSABLE) IMPLANT
GOWN STRL REUS W/ TWL XL LVL3 (GOWN DISPOSABLE) IMPLANT
GOWN STRL REUS W/TWL 2XL LVL3 (GOWN DISPOSABLE) ×3 IMPLANT
GOWN STRL REUS W/TWL LRG LVL3 (GOWN DISPOSABLE)
GOWN STRL REUS W/TWL XL LVL3 (GOWN DISPOSABLE)
HEMOSTAT SURGICEL 2X14 (HEMOSTASIS) ×3 IMPLANT
KIT BASIN OR (CUSTOM PROCEDURE TRAY) ×3 IMPLANT
KIT ROOM TURNOVER OR (KITS) ×3 IMPLANT
NEEDLE HYPO 22GX1.5 SAFETY (NEEDLE) ×3 IMPLANT
NS IRRIG 1000ML POUR BTL (IV SOLUTION) ×6 IMPLANT
PACK CRANIOTOMY (CUSTOM PROCEDURE TRAY) ×3 IMPLANT
PAD ARMBOARD 7.5X6 YLW CONV (MISCELLANEOUS) ×3 IMPLANT
PATTIES SURGICAL .25X.25 (GAUZE/BANDAGES/DRESSINGS) IMPLANT
PATTIES SURGICAL .5 X.5 (GAUZE/BANDAGES/DRESSINGS) IMPLANT
PATTIES SURGICAL .5 X3 (DISPOSABLE) IMPLANT
PATTIES SURGICAL .75X.75 (GAUZE/BANDAGES/DRESSINGS) ×3 IMPLANT
PATTIES SURGICAL 1X1 (DISPOSABLE) IMPLANT
PERFORATOR LRG  14-11MM (BIT) ×2
PERFORATOR LRG 14-11MM (BIT) ×1 IMPLANT
PLATE 1.5  2HOLE LNG NEURO (Plate) ×6 IMPLANT
PLATE 1.5 2HOLE LNG NEURO (Plate) ×3 IMPLANT
RUBBERBAND STERILE (MISCELLANEOUS) ×6 IMPLANT
SCREW SELF DRILL HT 1.5/4MM (Screw) ×18 IMPLANT
SPECIMEN JAR SMALL (MISCELLANEOUS) IMPLANT
SPONGE LAP 18X18 X RAY DECT (DISPOSABLE) IMPLANT
SPONGE NEURO XRAY DETECT 1X3 (DISPOSABLE) IMPLANT
SPONGE SURGIFOAM ABS GEL 100 (HEMOSTASIS) ×3 IMPLANT
STAPLER SKIN PROX WIDE 3.9 (STAPLE) ×3 IMPLANT
SUT ETHILON 3 0 FSL (SUTURE) IMPLANT
SUT NURALON 4 0 TR CR/8 (SUTURE) ×9 IMPLANT
SUT VIC AB 2-0 OS6 18 (SUTURE) ×21 IMPLANT
SUT VIC AB 3-0 CP2 18 (SUTURE) IMPLANT
SWABSTICK BENZOIN STERILE (MISCELLANEOUS) ×3 IMPLANT
SYR CONTROL 10ML LL (SYRINGE) ×3 IMPLANT
TOWEL OR 17X24 6PK STRL BLUE (TOWEL DISPOSABLE) ×3 IMPLANT
TOWEL OR 17X26 10 PK STRL BLUE (TOWEL DISPOSABLE) ×3 IMPLANT
TRAY FOLEY W/METER SILVER 14FR (SET/KITS/TRAYS/PACK) ×3 IMPLANT
TUBE CONNECTING 12'X1/4 (SUCTIONS) ×1
TUBE CONNECTING 12X1/4 (SUCTIONS) ×2 IMPLANT
UNDERPAD 30X30 INCONTINENT (UNDERPADS AND DIAPERS) ×3 IMPLANT
WATER STERILE IRR 1000ML POUR (IV SOLUTION) ×3 IMPLANT

## 2015-02-20 NOTE — Op Note (Signed)
Preop diagnosis: Right temporal mass lesion Postop diagnosis: Right temporoparietal malignant neoplasm Procedure: Right frontotemporal craniotomy for right temporal lobectomy and removal of temporal mass lesion Surgeon: Lyall Faciane Asst.: Jones  After being placed the supine position with head turned towards the left the patient's right scalp was shaved prepped and draped in the usual sterile fashion. Curvilinear incision was made starting at the zygomatic arch heading superiorly and then posteriorly to the posterior temporal region and towards the midline and then anterior towards the hairline. The temporal muscle was then incised and the entire flap was reflected anteriorly. It was reflected anteriorly with towel clips and rubber bands and clamps. We then fashioned a 3 hole frontotemporal craniotomy and remove the bone flap without difficulty. We removed some of the sphenoid wing to basically turn the frontal and temporal fossa and do a single compartment. We did an anterior and inferior craniectomy to extend her exposure to the temporal floor and towards the temporal tip. We then elevated the dura away from the underlying brain and opened it in a curvilinear fashion which basically paralleled the skin incision. Reflected the dural flap anteriorly. The brain was rather relaxed. Approximately 5 cm posterior to the temporal tip of medical corticectomy and began a temporal lobectomy. We encountered abnormal tissue which was sent for pathology and frozen section returned malignant neoplasm but they were not able to definitively give [place] an answer unfrozen. There is more tissue available which they're going to use for permanent section. We resected the temporal lobe down to the floor of the middle fossa and then swept things inferiorly and posteriorly until we were able to easily visualize most of the dura of the temporal fossa. At this point we noted no residual abnormal tissue. We irrigated copiously controlled any  bleeders proper coagulation and then lined the raw edges of the brain with Surgicel stamps. We then closed the dura with interrupted Nurolon stitches. We reattached the bone flap with 3 Lorenz clamps and then closed the temporal muscle and fascia and galea with interrupted Vicryls stitches. Staples were placed on the skin. A sterile dressing was then applied and the patient was extubated and taken to recovery room in stable condition.

## 2015-02-20 NOTE — Progress Notes (Signed)
Patient ID: Victor [name], male   DOB: 1986/01/22, 29 y.o.   MRN: [id number] Patient unchanged neurologically.  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 Will proceed with crani today with temporal lobectomy.

## 2015-02-20 NOTE — Anesthesia Preprocedure Evaluation (Addendum)
Anesthesia Evaluation  Patient identified by MRN, date of birth, ID band Patient confused    Reviewed: Allergy & Precautions, NPO status , Patient's Chart, lab work & pertinent test results  Airway Mallampati: II  TM Distance: >3 FB Neck ROM: Limited    Dental no notable dental hx. (+) Teeth Intact   Pulmonary neg pulmonary ROS, former smoker,  breath sounds clear to auscultation  Pulmonary exam normal       Cardiovascular negative cardio ROS Normal cardiovascular examRhythm:Regular Rate:Normal     Neuro/Psych negative neurological ROS  negative psych ROS   GI/Hepatic negative GI ROS, Neg liver ROS,   Endo/Other  negative endocrine ROS  Renal/GU negative Renal ROS  negative genitourinary   Musculoskeletal negative musculoskeletal ROS (+)   Abdominal   Peds negative pediatric ROS (+)  Hematology  (+) HIV, Just diagnosed with HIV. Viral load will be high   Anesthesia Other Findings   Reproductive/Obstetrics negative OB ROS                           Anesthesia Physical Anesthesia Plan  ASA: III  Anesthesia Plan: General   Post-op Pain Management:    Induction: Intravenous  Airway Management Planned: Oral ETT  Additional Equipment: Arterial line  Intra-op Plan:   Post-operative Plan: Possible Post-op intubation/ventilation  Informed Consent: I have reviewed the patients History and Physical, chart, labs and discussed the procedure including the risks, benefits and alternatives for the proposed anesthesia with the patient or authorized representative who has indicated his/her understanding and acceptance.   Dental advisory given  Plan Discussed with: CRNA and Surgeon  Anesthesia Plan Comments:         Anesthesia Quick Evaluation

## 2015-02-20 NOTE — Progress Notes (Signed)
[facility] PROGRESS NOTE  Victor [name] [facility] W[place]O:[id number] DOB: 04-05-1986 DOA: 02/18/2015 PCP: [name]  Brief Narrative: 29 y.o. male with no significant past medical history except for shingles of the left eye couple weeks prior to admission he was started on antiretroviral therapy and it improved. Most of the history is obtained from the mother asked the patient is not able to communicate. The mother relates that he had been having headache for the past 2 weeks, but 3 days prior to admission he was so lethargic that they couldn't communicate with him. Patient currently undergoing work up by neurology. On evaluation was found to have brain lesions and Neurosurgeon consulted. Recent HIV positive result. ID consulted  Assessment/Plan: 1. Acute encephalopathy - Neurology assisting - Currently undergoing work up.  2. Brain lesion - Neurosurgeon on board and recommending diagnostic biopsy as well as removal for decreasing mass effect.  3. Anemia - no active bleeding reported.  - Will obtain anemia panel  4. HIV positive result - ID consulted and currently assisting with work up - Differential now includes: Primary CNS lymphoma, Toxoplasmosis, cryptococcal infection of CNF, indolent infection such as Tuberculoma, histoplasma, blastomyces infection - Will await biopsy results form procedure to be performed today by neurosurgeon  Code Status: full Family Communication: discussed with mother at bedside.  Disposition Plan: pending improvement in condition.   Consultants:  Neurosurgery  Neurology  ID  Procedures:  Pending  Antibiotics:  Vancomycin  Rocephin  Antiviral: Acyclovir  HPI/Subjective: No new complaints. No acute issues overnight.  Objective: Filed Vitals:   02/20/15 0700  BP:   Pulse:   Temp: 97 F (36.1 C)  Resp:     Intake/Output Summary (Last 24 hours) at 02/20/15 1151 Last data filed at 02/20/15 0700  Gross per 24 hour   Intake 1886.25 ml  Output    925 ml  Net 961.25 ml   Filed Weights   02/18/15 1055 02/18/15 2340  Weight: 86.183 kg (190 lb) 71.7 kg (158 lb 1.1 oz)    Exam:   General:  Pt resting comfortably, in Nad  Cardiovascular: rrr, no mrg  Respiratory: cta bl, no wheezes  Abdomen: soft, ND, NT  Musculoskeletal: no cyanosis or clubbing   Data Reviewed: Basic Metabolic Panel:  Recent Labs Lab 02/18/15 1036 02/19/15 1333  NA 136 134*  K 4.1 4.4  CL 96* 101  CO2 28 22  GLUCOSE 118* 129*  BUN 23* 12  CREATININE 1.02 0.75  CALCIUM 10.1 8.8*   Liver Function Tests: No results for input(s): AST, ALT, ALKPHOS, BILITOT, PROT, ALBUMIN in the last 168 hours. No results for input(s): LIPASE, AMYLASE in the last 168 hours. No results for input(s): AMMONIA in the last 168 hours. CBC:  Recent Labs Lab 02/18/15 1036 02/19/15 1333 02/20/15 0435  WBC 7.0 4.8 6.2  NEUTROABS 5.7  --   --   HGB 16.3 12.0* 11.1*  HCT 48.1 35.3* 32.9*  MCV 83.5 81.9 82.7  PLT 373 314 311   Cardiac Enzymes: No results for input(s): CKTOTAL, CKMB, CKMBINDEX, TROPONINI in the last 168 hours. BNP (last 3 results) No results for input(s): BNP in the last 8760 hours.  ProBNP (last 3 results) No results for input(s): PROBNP in the last 8760 hours.  CBG: No results for input(s): GLUCAP in the last 168 hours.  Recent Results (from the past 240 hour(s))  Culture, blood (routine x 2)     Status: None (Preliminary result)   Collection Time:  02/18/15 11:15 AM  Result Value Ref Range Status   Specimen Description BLOOD RIGHT ANTECUBITAL  Final   Special Requests BOTTLES DRAWN AEROBIC AND ANAEROBIC 5ML  Final   Culture  Setup Time   Final    GRAM POSITIVE COCCI IN CLUSTERS IN BOTH AEROBIC AND ANAEROBIC BOTTLES CRITICAL RESULT CALLED TO, READ BACK BY AND VERIFIED WITH: RRayburn Go AT [id number] ON [id number] BY Rhea Bleacher    Culture   Final    NO GROWTH 1 DAY Performed at [facility]    Report  Status PENDING  Incomplete  Culture, blood (routine x 2)     Status: None (Preliminary result)   Collection Time: 02/18/15 11:23 AM  Result Value Ref Range Status   Specimen Description BLOOD LEFT ANTECUBITAL  Final   Special Requests BOTTLES DRAWN AEROBIC AND ANAEROBIC 5ML  Final   Culture   Final    NO GROWTH 1 DAY Performed at [facility]    Report Status PENDING  Incomplete  MRSA PCR Screening     Status: None   Collection Time: 02/19/15 12:02 AM  Result Value Ref Range Status   MRSA by PCR NEGATIVE NEGATIVE Final    Comment:        The GeneXpert MRSA Assay (FDA approved for NASAL specimens only), is one component of a comprehensive MRSA colonization surveillance program. It is not intended to diagnose MRSA infection nor to guide or monitor treatment for MRSA infections.      Studies: Ct Cervical Spine Wo Contrast  02/18/2015   CLINICAL DATA:  Injury post fall  EXAM: CT CERVICAL SPINE WITHOUT CONTRAST  TECHNIQUE: Multidetector CT imaging of the cervical spine was performed without intravenous contrast. Multiplanar CT image reconstructions were also generated.  COMPARISON:  None.  FINDINGS: Axial images of the cervical spine shows no acute fracture or subluxation. Computer processed images shows no acute fracture or subluxation. There is partial opacification of left mastoid air cells. No pneumothorax in visualized lung apices. Alignment, disc spaces and vertebral body heights are preserved.  IMPRESSION: No cervical spine acute fracture or subluxation. There is no pneumothorax in visualized lung apices. Partial opacification of left mastoid air cells.   Electronically Signed   By: Lahoma Crocker M.D.   On: 02/18/2015 12:38   Mr Jeri Cos KZ Contrast  02/18/2015   ADDENDUM REPORT: 02/18/2015 19:03  ADDENDUM: Study discussed by telephone with Dr. Olevia Bowens on 02/18/2015 at [place] hrs. In addition to the above clinical data, Dr. Olevia Bowens reports the patient has had 3 months of unexplained  weight loss. The patient had been on antiviral medications for orbital shingles for 2 weeks. He also advised that he is screening the patient for HIV, which I think is prudent.   Electronically Signed   By: Genevie Ann M.D.   On: 02/18/2015 19:03   02/18/2015   CLINICAL DATA:  29 year old male is decreasing responsiveness and headaches for 3 days. 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 Right basal ganglia masslike abnormality on head CT earlier today. No fever in the emergency department. Initial encounter.  EXAM: MRI HEAD WITHOUT AND WITH CONTRAST  TECHNIQUE: Multiplanar, multiecho pulse sequences of the brain and surrounding structures were obtained without and with intravenous contrast.  CONTRAST:  72mL MULTIHANCE GADOBENATE DIMEGLUMINE 529 MG/ML IV SOLN  COMPARISON:  Head CT without contrast 1046 hr today.  FINDINGS: Both caudate nuclei are abnormally enlarged and T2 heterogeneous, with mass like somewhat  intermediate T2 signal, but the right side is much worse with diffuse basal ganglia involvement. There are some areas of restricted diffusion, but there is a petechial pattern of abnormal right basal ganglia enhancement, also with demonstration of mildly enlarged perforating vessels serving this region (series 7, image 12). There is also petechial abnormal hyper enhancement in the anterior right middle gyrus in a nodular cluster (series 14, image 37, with mild T2 and FLAIR hyperintensity here which is fairly contiguous with that about the right basal ganglia.  Furthermore, there is abnormal signal in both medial thalami (same image), and abnormal T2 and FLAIR hyperintensity tracking from the midbrain into the periaqueductal gray matter and also with subtle nodularity and hyperintensity along the fourth ventricle (series 8, image 6). No associated enhancement or significant mass effect at these sites.  There is also diffuse T2 and FLAIR  hyperintensity tracking from the right basal ganglia into the right temporal lobe, in a vasogenic edema type pattern. There are multiple dark T2 masslike areas in the right temporal lobe which are both peripheral (extending to the cortical surface) and at the gray-white junctions. As these rounded temporal lobe lesions range from 5 to 32 mm diameter and demonstrate mostly homogeneous enhancement and restricted diffusion.  There is intracranial mass effect with left to right midline shift up to 8 mm. There is effacement of the right lateral ventricle and cavum septum callosum. No ventriculomegaly. No debris within the ventricles. No dural thickening or meningeal enhancement. Basilar cisterns remain patent. No restricted diffusion in a pattern suggestive of acute infarction. Negative pituitary, cervicomedullary junction, and visualized cervical spine. No definite acute intracranial hemorrhage. Major intracranial vascular flow voids are preserved.  There are inflammatory changes in the paranasal sinuses and left mastoids. Intraorbital soft tissues appear within normal limits. Scalp soft tissues appear within normal limits. Overall bone marrow signal is within normal limits.  IMPRESSION: Markedly abnormal brain with multifocal mostly intermediate to low T2 signal masslike lesions with enhancement and edema. Right greater than left hemisphere involvement, also with nonenhancing signal abnormality tracking along the periaqueductal gray matter and fourth ventricle.  Top differential considerations are primary CNS lymphoma (favored) and multicentric glioblastoma. Infectious etiology (severe encephalitis) is considered but felt much less likely.  Intracranial mass effect with leftward midline shift of 8 mm and ventricular effacement. Basilar cisterns remain patent. No ventriculomegaly.  Electronically Signed: By: Genevie Ann M.D. On: 02/18/2015 18:38    Scheduled Meds: . acyclovir  10 mg/kg Intravenous 3 times per day  .  antiseptic oral rinse  7 mL Mouth Rinse q12n4p  . cefTRIAXone (ROCEPHIN)  IV  2 g Intravenous Q12H  . chlorhexidine  15 mL Mouth Rinse BID  . dexamethasone  4 mg Intravenous 4 times per day  . enoxaparin (LOVENOX) injection  40 mg Subcutaneous Q24H  . sodium chloride  3 mL Intravenous Q12H  . vancomycin  1,000 mg Intravenous Q8H   Continuous Infusions: . sodium chloride 75 mL/hr at 02/20/15 0357    Active Problems:   Acute encephalopathy   Brain mass   Vasogenic brain edema   Severe protein-calorie malnutrition   Fever   Altered mental status    Time spent: > 35 minutes    Velvet Bathe  [facility] Pager [phone]. If 7PM-7AM, please contact night-coverage at [email or web address], password [facility] 02/20/2015, 11:51 AM  LOS: 2 days

## 2015-02-20 NOTE — Progress Notes (Signed)
ANTIBIOTIC CONSULT NOTE - Follow up Pharmacy Consult for Vancomycin Indication: meningitis  Allergies  Allergen Reactions  . Shrimp [Shellfish Allergy] Swelling  . Fish Allergy Swelling    Patient Measurements: Height: 6\' 3"  (190.5 cm) Weight: 158 lb 1.1 oz (71.7 kg) IBW/kg (Calculated) : 84.5 Height: 6'3"  Vital Signs: Temp: 97 F (36.1 C) (08/10 1600) Temp Source: Axillary (08/10 1600) BP: 126/105 mmHg (08/10 1600) Pulse Rate: 92 (08/10 1600) Intake/Output from previous day: 08/09 0701 - 08/10 0700 In: 2261.3 [I.V.:1646.3; IV Piggyback:615] Out: 1200 [Urine:1200] Intake/Output from this shift: Total I/O In: 2100 [I.V.:2100] Out: 875 [Urine:725; Blood:150]  Labs:  Recent Labs  02/18/15 1036 02/19/15 1333 02/20/15 0435  WBC 7.0 4.8 6.2  HGB 16.3 12.0* 11.1*  PLT 373 314 311  CREATININE 1.02 0.75  --    Estimated Creatinine Clearance: 138.2 mL/min (by C-G formula based on Cr of 0.75).  Recent Labs  02/20/15 0439  [facility] 12     Microbiology: Recent Results (from the past 720 hour(s))  Culture, blood (routine x 2)     Status: None (Preliminary result)   Collection Time: 02/18/15 11:15 AM  Result Value Ref Range Status   Specimen Description BLOOD RIGHT ANTECUBITAL  Final   Special Requests BOTTLES DRAWN AEROBIC AND ANAEROBIC 5ML  Final   Culture  Setup Time   Final    GRAM POSITIVE COCCI IN CLUSTERS IN BOTH AEROBIC AND ANAEROBIC BOTTLES CRITICAL RESULT CALLED TO, READ BACK BY AND VERIFIED WITH: R. Joon Pohle,RN AT 6295 ON [id number] BY Rhea Bleacher    Culture   Final    STAPHYLOCOCCUS SPECIES (COAGULASE NEGATIVE) THE SIGNIFICANCE OF ISOLATING THIS ORGANISM FROM A SINGLE SET OF BLOOD CULTURES WHEN MULTIPLE SETS ARE DRAWN IS UNCERTAIN. PLEASE NOTIFY THE MICROBIOLOGY DEPARTMENT WITHIN ONE WEEK IF SPECIATION AND SENSITIVITIES ARE REQUIRED. Performed at [facility]    Report Status PENDING  Incomplete  Culture, blood (routine x 2)     Status: None  (Preliminary result)   Collection Time: 02/18/15 11:23 AM  Result Value Ref Range Status   Specimen Description BLOOD LEFT ANTECUBITAL  Final   Special Requests BOTTLES DRAWN AEROBIC AND ANAEROBIC 5ML  Final   Culture   Final    NO GROWTH 2 DAYS Performed at [facility]    Report Status PENDING  Incomplete  MRSA PCR Screening     Status: None   Collection Time: 02/19/15 12:02 AM  Result Value Ref Range Status   MRSA by PCR NEGATIVE NEGATIVE Final    Comment:        The GeneXpert MRSA Assay (FDA approved for NASAL specimens only), is one component of a comprehensive MRSA colonization surveillance program. It is not intended to diagnose MRSA infection nor to guide or monitor treatment for MRSA infections.     Medical History: Past Medical History  Diagnosis Date  . Dental abscess 02/15/2012     Assessment: 64 y/oM with PMH of dental abscess, recently diagnosed with shingles of L eye (01/11/15) who [facility] ED with altered mental status. Concern for encephalitis/meningitis/CNS shingles. CT scan of head revealed nodular heterogeneous mass in right basal ganglia/ lateral to caudate nucleus measuring at least 3.5 cm with vasogenic edema in right frontal and right temporal lobe. 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   Currently on day# 3 of Vancomycin 1g IV q8hr. Vancomycin trough this AM = 12 mcg/ml (drawn ~ 1 hr early). Vancomycin trough is below goal of 15-20 mcg/ml for possible meningitis  treatment.  The patient is s/p craniotomy, R temporal lobectomy today 02/20/15. Post op diagnosis: right temporoparietal malignant neoplasm. Dr. Hal Neer discontinued the ceftriaxone.  Afebrile, Tmax: 98.58F, WBC wnl  SCr 0.75 with CrCl > 100 ml/min   8/8 >> Vancomycin >> 8/8 >> Ceftriaxone >>8/10 8/8 >> Acyclovir >>    8/8 blood x 2:  1 of 2 staph coag neg 8/9 MRSA PCR negative   Goal of Therapy:  Vancomycin trough level 15-20 mcg/ml  Appropriate antibiotic dosing for renal function and  indication Eradication of infection  Plan:  Increase vancomycin to 1250 mg IV q8h.    Check Vancomycin trough level at steady state.  Monitor renal function, cultures, clinical course.   Thank you for allowing pharmacy to be part of this patients care team. Nicole Cella, RPh Clinical Pharmacist Pager: [phone] 02/20/2015 4:35 PM

## 2015-02-20 NOTE — Consult Note (Addendum)
[place] [facility]    Date of Admission:  02/18/2015  Date of Consult:  02/20/2015  Reason for Consult: HIV/AIDS, and CNS mass Referring Physician: Auto-consult (+HIV test), Dr. Wendee Beavers and Dr. Hal Neer   HPI: Victor [name] is an 29 y.o. male who is not known previously to have any medical problems until he was recently diagnosed with zoster ophthalmicus. He was treated for that with Valtrex. He then began to have worsening headaches and 3 daughters prior to admission became extremely lethargic and could not communicate with his mother. He was found on the floor confused and unable to follow commands. He was brought to the hospital where he was found to be febrile. CT scan of the brain showed a mass in the right basal ganglia lateral to the caudate nucleus measures at least 3.5 cm. Vasogenic edema in right frontal and right temporal lobe. Mass effect on right lateral ventricle.  He had MRI of the brain as well which showed: Markedly abnormal brain with multifocal mostly intermediate to low T2 signal masslike lesions with enhancement and edema. Right greater than left hemisphere involvement, also with nonenhancing signal abnormality tracking along the periaqueductal gray matter and fourth Ventricle.  He was started on vancomycin, ceftriaxone and steroids and seemed to improve somewhat with steroids.  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  Since I visited him this morning he has gone to the operating room for resection of this mass and frozen pathology has come back consistent with a malignancy.        Past Medical History  Diagnosis Date  . Dental abscess 02/15/2012    History reviewed. No pertinent past surgical history.ergies:   Allergies  Allergen Reactions  . Shrimp [Shellfish Allergy] Swelling  . Fish Allergy Swelling      Medications: I have reviewed patients current medications as documented in Epic Anti-infectives    Start     Dose/Rate Route Frequency Ordered Stop   02/20/15 2200  vancomycin (VANCOCIN) 1,250 mg in sodium chloride 0.9 % 250 mL IVPB  Status:  Discontinued     1,250 mg 166.7 mL/hr over 90 Minutes Intravenous Every 8 hours 02/20/15 1643 02/20/15 1801   02/20/15 1349  vancomycin (VANCOCIN) 1 GM/200ML IVPB  Status:  Discontinued    Comments:  [name]   : cabinet override      02/20/15 1349 02/20/15 1607   02/20/15 1346  vancomycin (VANCOCIN) 1 GM/200ML IVPB  Status:  Discontinued    Comments:  [name]   : cabinet override      02/20/15 1346 02/20/15 1351   02/20/15 1323  bacitracin 50,000 Units in sodium chloride irrigation 0.9 % 500 mL irrigation  Status:  Discontinued       As needed 02/20/15 1323 02/20/15 1518   02/19/15 2000  acyclovir (ZOVIRAX) 715 mg in dextrose 5 % 150 mL IVPB  Status:  Discontinued     10 mg/kg  71.7 kg 164.3 mL/hr over 60 Minutes Intravenous 3 times per day 02/19/15 1419 02/20/15 1801   02/18/15 2200  vancomycin (VANCOCIN) IVPB 1000 mg/200 mL premix  Status:  Discontinued     1,000 mg 200 mL/hr over 60 Minutes Intravenous Every 8 hours 02/18/15 1146 02/20/15 1643   02/18/15 2200  cefTRIAXone (ROCEPHIN) 2 g in dextrose 5 % 50 mL  IVPB  Status:  Discontinued     2 g 100 mL/hr over 30 Minutes Intravenous Every 12 hours 02/18/15 1618 02/20/15 1535   02/18/15 1115  vancomycin (VANCOCIN) 1,750 mg in sodium chloride 0.9 % 500 mL IVPB     1,750 mg 250 mL/hr over 120 Minutes Intravenous STAT 02/18/15 1101 02/18/15 1620   02/18/15 1100  acyclovir (ZOVIRAX) 860 mg in dextrose 5 % 150 mL IVPB  Status:  Discontinued     10 mg/kg  86.2 kg 167.2 mL/hr over 60 Minutes Intravenous 3 times per day 02/18/15 1030 02/19/15 1419   02/18/15 1045  vancomycin (VANCOCIN) IVPB 1000 mg/200 mL premix  Status:  Discontinued     1,000 mg 200 mL/hr over 60 Minutes  Intravenous  Once 02/18/15 1030 02/18/15 1101   02/18/15 1045  cefTRIAXone (ROCEPHIN) 2 g in dextrose 5 % 50 mL IVPB  Status:  Discontinued     2 g 100 mL/hr over 30 Minutes Intravenous Every 12 hours 02/18/15 1030 02/18/15 1618      Social History:  reports that he has quit smoking. His smoking use included Cigarettes. He has never used smokeless tobacco. He reports that he drinks alcohol. He reports that he does not use illicit drugs.  History reviewed. No pertinent family history.  As in HPI and primary teams notes otherwise 12 point review of systems is negative  Blood pressure 118/71, pulse 103, temperature 97 F (36.1 C), temperature source Axillary, resp. rate 14, height 6' 3"  (1.905 m), weight 158 lb 1.1 oz (71.7 kg), SpO2 94 %. General: Somnolent and would not arouse to my verbal communication with him I do not apply noxious stimuli to him.  HEENT: anicteric sclera, pupils reactive to light and accommodation, EOMI, oropharynx clear and without exudate CVS regular rate, normal r,  no murmur rubs or gallops Chest: clear to auscultation bilaterally, no wheezing, rales or rhonchi Abdomen: softondistended, normal bowel sounds, Extremities: no  clubbing or edema noted bilaterally Skin: no rashes Neuro: nonfocal,   Results for orders placed or performed during the hospital encounter of 02/18/15 (from the past 48 hour(s))  MRSA PCR Screening     Status: None   Collection Time: 02/19/15 12:02 AM  Result Value Ref Range   MRSA by PCR NEGATIVE NEGATIVE    Comment:        The GeneXpert MRSA Assay (FDA approved for NASAL specimens only), is one component of a comprehensive MRSA colonization surveillance program. It is not intended to diagnose MRSA infection nor to guide or monitor treatment for MRSA infections.   Basic metabolic panel     Status: Abnormal   Collection Time: 02/19/15  1:33 PM  Result Value Ref Range   Sodium 134 (L) 135 - 145 mmol/L   Potassium 4.4 3.5 - 5.1  mmol/L   Chloride 101 101 - 111 mmol/L   CO2 22 22 - 32 mmol/L   Glucose, Bld 129 (H) 65 - 99 mg/dL   BUN 12 6 - 20 mg/dL   Creatinine, Ser 0.75 0.61 - 1.24 mg/dL   Calcium 8.8 (L) 8.9 - 10.3 mg/dL   GFR calc non Af Amer >60 >60 mL/min   GFR calc Af Amer >60 >60 mL/min    Comment: (NOTE) The eGFR has been calculated using the CKD EPI equation. This calculation has not been validated in all clinical situations. eGFR's persistently <60 mL/min signify possible Chronic Kidney Disease.    Anion gap 11 5 - 15  CBC  Status: Abnormal   Collection Time: 02/19/15  1:33 PM  Result Value Ref Range   WBC 4.8 4.0 - 10.5 K/uL   RBC 4.31 4.22 - 5.81 MIL/uL   Hemoglobin 12.0 (L) 13.0 - 17.0 g/dL   HCT 35.3 (L) 39.0 - 52.0 %   MCV 81.9 78.0 - 100.0 fL   MCH 27.8 26.0 - 34.0 pg   MCHC 34.0 30.0 - 36.0 g/dL   RDW 13.5 11.5 - 15.5 %   Platelets 314 150 - 400 K/uL  Lactic acid, plasma     Status: None   Collection Time: 02/19/15  1:33 PM  Result Value Ref Range   Lactic Acid, Venous 1.0 0.5 - 2.0 mmol/L  Folate     Status: Abnormal   Collection Time: 02/19/15  3:43 PM  Result Value Ref Range   Folate 3.8 (L) >5.9 ng/mL  Vitamin B12     Status: None   Collection Time: 02/19/15  3:45 PM  Result Value Ref Range   Vitamin B-12 747 180 - 914 pg/mL    Comment: (NOTE) This assay is not validated for testing neonatal or myeloproliferative syndrome specimens for Vitamin B12 levels.   Iron and TIBC     Status: Abnormal   Collection Time: 02/19/15  3:45 PM  Result Value Ref Range   Iron 83 45 - 182 ug/dL   TIBC 220 (L) 250 - 450 ug/dL   Saturation Ratios 38 17.9 - 39.5 %   UIBC 137 ug/dL  Ferritin     Status: Abnormal   Collection Time: 02/19/15  3:45 PM  Result Value Ref Range   Ferritin 2079 (H) 24 - 336 ng/mL  Reticulocytes     Status: Abnormal   Collection Time: 02/19/15  3:45 PM  Result Value Ref Range   Retic Ct Pct 0.5 0.4 - 3.1 %   RBC. 4.18 (L) 4.22 - 5.81 MIL/uL   Retic Count,  Manual 20.9 19.0 - 186.0 K/uL  Lactic acid, plasma     Status: None   Collection Time: 02/19/15  4:13 PM  Result Value Ref Range   Lactic Acid, Venous 1.0 0.5 - 2.0 mmol/L  CBC     Status: Abnormal   Collection Time: 02/20/15  4:35 AM  Result Value Ref Range   WBC 6.2 4.0 - 10.5 K/uL   RBC 3.98 (L) 4.22 - 5.81 MIL/uL   Hemoglobin 11.1 (L) 13.0 - 17.0 g/dL   HCT 32.9 (L) 39.0 - 52.0 %   MCV 82.7 78.0 - 100.0 fL   MCH 27.9 26.0 - 34.0 pg   MCHC 33.7 30.0 - 36.0 g/dL   RDW 13.6 11.5 - 15.5 %   Platelets 311 150 - 400 K/uL  Cryptococcal antigen     Status: None   Collection Time: 02/20/15  4:35 AM  Result Value Ref Range   Crypto Ag NEGATIVE NEGATIVE   Cryptococcal Ag Titer NOT INDICATED NOT INDICATED  Vancomycin, trough     Status: None   Collection Time: 02/20/15  4:39 AM  Result Value Ref Range   Vancomycin Tr 12 10.0 - 20.0 ug/mL  Basic metabolic panel     Status: Abnormal   Collection Time: 02/20/15  4:00 PM  Result Value Ref Range   Sodium 133 (L) 135 - 145 mmol/L   Potassium 3.7 3.5 - 5.1 mmol/L   Chloride 102 101 - 111 mmol/L   CO2 22 22 - 32 mmol/L   Glucose, Bld 122 (H) 65 - 99  mg/dL   BUN 10 6 - 20 mg/dL   Creatinine, Ser 0.74 0.61 - 1.24 mg/dL   Calcium 8.7 (L) 8.9 - 10.3 mg/dL   GFR calc non Af Amer >60 >60 mL/min   GFR calc Af Amer >60 >60 mL/min    Comment: (NOTE) The eGFR has been calculated using the CKD EPI equation. This calculation has not been validated in all clinical situations. eGFR's persistently <60 mL/min signify possible Chronic Kidney Disease.    Anion gap 9 5 - 15  CBC     Status: Abnormal   Collection Time: 02/20/15  4:00 PM  Result Value Ref Range   WBC 4.7 4.0 - 10.5 K/uL   RBC 4.22 4.22 - 5.81 MIL/uL   Hemoglobin 11.6 (L) 13.0 - 17.0 g/dL   HCT 34.9 (L) 39.0 - 52.0 %   MCV 82.7 78.0 - 100.0 fL   MCH 27.5 26.0 - 34.0 pg   MCHC 33.2 30.0 - 36.0 g/dL   RDW 13.8 11.5 - 15.5 %   Platelets 349 150 - 400 K/uL    @BRIEFLABTABLE (sdes,specrequest,cult,reptstatus)   ) Recent Results (from the past 720 hour(s))  Culture, blood (routine x 2)     Status: None (Preliminary result)   Collection Time: 02/18/15 11:15 AM  Result Value Ref Range Status   Specimen Description BLOOD RIGHT ANTECUBITAL  Final   Special Requests BOTTLES DRAWN AEROBIC AND ANAEROBIC 5ML  Final   Culture  Setup Time   Final    GRAM POSITIVE COCCI IN CLUSTERS IN BOTH AEROBIC AND ANAEROBIC BOTTLES CRITICAL RESULT CALLED TO, READ BACK BY AND VERIFIED WITH: R. CLARK,RN AT [id number] ON [id number] BY Rhea Bleacher    Culture   Final    STAPHYLOCOCCUS SPECIES (COAGULASE NEGATIVE) THE SIGNIFICANCE OF ISOLATING THIS ORGANISM FROM A SINGLE SET OF BLOOD CULTURES WHEN MULTIPLE SETS ARE DRAWN IS UNCERTAIN. PLEASE NOTIFY THE MICROBIOLOGY DEPARTMENT WITHIN ONE WEEK IF SPECIATION AND SENSITIVITIES ARE REQUIRED. Performed at [facility]    Report Status PENDING  Incomplete  Culture, blood (routine x 2)     Status: None (Preliminary result)   Collection Time: 02/18/15 11:23 AM  Result Value Ref Range Status   Specimen Description BLOOD LEFT ANTECUBITAL  Final   Special Requests BOTTLES DRAWN AEROBIC AND ANAEROBIC 5ML  Final   Culture   Final    NO GROWTH 2 DAYS Performed at [facility]    Report Status PENDING  Incomplete  MRSA PCR Screening     Status: None   Collection Time: 02/19/15 12:02 AM  Result Value Ref Range Status   MRSA by PCR NEGATIVE NEGATIVE Final    Comment:        The GeneXpert MRSA Assay (FDA approved for NASAL specimens only), is one component of a comprehensive MRSA colonization surveillance program. It is not intended to diagnose MRSA infection nor to guide or monitor treatment for MRSA infections.      Impression/Recommendation  Active Problems:   Acute encephalopathy   Brain mass   Vasogenic brain edema   Severe protein-calorie malnutrition   Fever   Altered mental status   Victor [name] is a 29 y.o. 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  She has healthcare power of attorney and I felt that to inform her care I did need to disclose to her the fact that he tested positive for HIV. Mom was very compassionate towards her son and accepting of this diagnosis.  She stated emphatically that both she and her husband would fully in brace and support her son.  After extensive discussion she informed me that the only members of her family that should be made aware of the patient's diagnosis besides the patient himself should be herself and her husband. She should be the focal point however for decision-making at this point in time.  Given the fact that the patient now seems toe have a primary CNS lymphoma instituting anti-retroviral therapy will be paramount.  I will start the patient tomorrow on Tivicay and Truvada and discuss this with mom  He will also need OI prophylaxis in form of daily bactrim and weekly azithromycin  The Tivicay and Truvada SHOULD NOT interact with chemoatherapy and the ARV will give him a better shot at living longer than chemo, steroids, XRT alone.  We can get him an expedited ADAP applcation once VL and CD4 back and he clearly will qualify for disability  #2 Brain lesion--malignancy  I am holding out hope that this isnt actually a primary CNS lymphoma but it looks to be, if so we have had a handful survive for longer than the typical year that has been historical norm.  I would strongly consider referral for clinical trial for primary CNS lymphoma and he will clearly need oncology consult.  I have stopped his acyclovir vancomycin and ceftriaxone.since  this is NOT a brain abscess  I spent greater than 80 minutes with the patient including greater than 50% of time in face to face counsel of the patient and Mom and in coordination of their care.    02/20/2015, 6:03 PM   Thank you so much for this interesting consult  [place] [facility] [phone] (pager) [phone] (office) 02/20/2015, 6:03 PM  Rhina Brackett Dam 02/20/2015, 6:03 PM

## 2015-02-20 NOTE — Progress Notes (Signed)
RT Note: Patient arrived to unit after surgery and was placed on vent settings per anesthesia. Patient is currently maintaining well and RT will continue to monitor.

## 2015-02-20 NOTE — Consult Note (Signed)
PULMONARY / CRITICAL CARE MEDICINE   Name: Victor [name] MRN: [id number] DOB: [birth year]    ADMISSION DATE:  02/18/2015 CONSULTATION DATE:  8/10  REFERRING MD :  Kritzer  CHIEF COMPLAINT:  Vent management   INITIAL PRESENTATION:  29yo male with no PMH except for shingles in L eye several weeks prior to admission (rx with antiviral) presented 8/8 with 2 week hx headache, weight loss and progressive lethargy and confusion.  Found to have several lesions in R temporal lobe with mass effect.  Seen in consultation by neurosurgery and taken 8/10 to OR for craniotomy and R temporal lobectomy.  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 PCCM consulted post op for vent management.   STUDIES:  MR brain 8/8>>> Markedly abnormal brain with multifocal mostly intermediate to low T2 signal masslike lesions with enhancement and edema. Right greater than left hemisphere involvement, also with nonenhancing signal abnormality tracking along the periaqueductal gray matter and fourth ventricle.   Intracranial mass effect with leftward midline shift of 8 mm and ventricular effacement. Basilar cisterns remain patent. No ventriculomegaly.   SIGNIFICANT EVENTS: 8/10>> OR for crani, R temporal lobectomy   HISTORY OF PRESENT ILLNESS:  29yo male with no PMH except for shingles in L eye several weeks prior to admission (rx with antiviral) presented 8/8 with 2 week hx headache, weight loss and progressive lethargy and confusion.  Found to have several lesions in R temporal lobe with mass effect.  Seen in consultation by neurosurgery and taken 8/10 to OR for craniotomy and R temporal lobectomy.  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 PCCM consulted post op for vent management.    PAST MEDICAL HISTORY :   has a past medical history of Dental abscess (02/15/2012).  has no past surgical history on file. Prior to Admission medications   Medication Sig Start Date End Date Taking? Authorizing Provider  cyclopentolate (CYCLODRYL,CYCLOGYL) 1 % ophthalmic  solution Place 1 drop into the left eye 4 (four) times daily.    Yes Historical Provider, MD  erythromycin ophthalmic ointment Place a 1/2 inch ribbon of ointment into the lower eyelid. 4/0/98  Yes Delora Fuel, MD  prednisoLONE acetate (PRED FORTE) 1 % ophthalmic suspension Place 1 drop into the right eye 4 (four) times daily.    Yes Historical Provider, MD  valACYclovir (VALTREX) 1000 MG tablet Take 1,000 mg by mouth 3 (three) times daily. For 10 days 02/08/15  Yes Historical Provider, MD  oxyCODONE-acetaminophen (PERCOCET) 5-325 MG per tablet Take 1 tablet by mouth every 4 (four) hours as needed for moderate pain. Patient not taking: Reported on 02/18/2015 07/13/89   Delora Fuel, MD   Allergies  Allergen Reactions  . Shrimp [Shellfish Allergy] Swelling  . Fish Allergy Swelling    FAMILY HISTORY:  has no family status information on file.  SOCIAL HISTORY:  reports that he has quit smoking. His smoking use included Cigarettes. He has never used smokeless tobacco. He reports that he drinks alcohol. He reports that he does not use illicit drugs.  REVIEW OF SYSTEMS:  Unable.  Sedated post op.   SUBJECTIVE:   VITAL SIGNS: Temp:  [97 F (36.1 C)-98.4 F (36.9 C)] 97.4 F (36.3 C) (08/10 1132) Pulse Rate:  [63-79] 65 (08/10 1136) Resp:  [13-30] 21 (08/10 1136) BP: (114-120)/(67-79) 120/67 mmHg (08/10 1134) SpO2:  [94 %-99 %] 96 % (08/10 1136) FiO2 (%):  [40 %] 40 % (08/10 1515) HEMODYNAMICS:   VENTILATOR SETTINGS: Vent Mode:  [-] PRVC FiO2 (%):  [40 %] 40 %  Set Rate:  [12 bmp] 12 bmp Vt Set:  [650 mL] 650 mL PEEP:  [5 cmH20] 5 cmH20 Plateau Pressure:  [15 cmH20] 15 cmH20 INTAKE / OUTPUT:  Intake/Output Summary (Last 24 hours) at 02/20/15 1602 Last data filed at 02/20/15 1528  Gross per 24 hour  Intake 3986.25 ml  Output   1800 ml  Net 2186.25 ml    PHYSICAL EXAMINATION: General:  Thin young male, NAD on vent  Neuro:  RASS -1 on propfol, opens eyes intermittently, does not  follow commands. Per RN was apneic on SBT post op with no sedation. Has had poor mental status last several days prior to OR.  HEENT:  Mm moist, ETT Cardiovascular:  s1s2 rrr Lungs:  resps even non labored on vent, coarse  Abdomen:  Soft, +bs  Musculoskeletal:  Warm and dry, no edema   LABS:  CBC  Recent Labs Lab 02/18/15 1036 02/19/15 1333 02/20/15 0435  WBC 7.0 4.8 6.2  HGB 16.3 12.0* 11.1*  HCT 48.1 35.3* 32.9*  PLT 373 314 311   Coag's  Recent Labs Lab 02/18/15 1502  APTT 36  INR 1.24   BMET  Recent Labs Lab 02/18/15 1036 02/19/15 1333  NA 136 134*  K 4.1 4.4  CL 96* 101  CO2 28 22  BUN 23* 12  CREATININE 1.02 0.75  GLUCOSE 118* 129*   Electrolytes  Recent Labs Lab 02/18/15 1036 02/19/15 1333  CALCIUM 10.1 8.8*   Sepsis Markers  Recent Labs Lab 02/18/15 1500 02/19/15 1333 02/19/15 1613  LATICACIDVEN 1.2 1.0 1.0   ABG No results for input(s): PHART, PCO2ART, PO2ART in the last 168 hours. Liver Enzymes No results for input(s): AST, ALT, ALKPHOS, BILITOT, ALBUMIN in the last 168 hours. Cardiac Enzymes No results for input(s): TROPONINI, PROBNP in the last 168 hours. Glucose No results for input(s): GLUCAP in the last 168 hours.  Imaging No results found.   ASSESSMENT / PLAN:  PULMONARY OETT 8/10>>> Acute respiratory failure - post op neurosurgery  P:   Vent support - 8cc/kg  F/u CXR  F/u ABG  Rest overnight with propofol PRN with WUA in am  Suspect mental status will remain barrier to weaning - lethargy is what prompted admission and it seems as though his mental status has remained marginal since.    NEUROLOGIC Acute encephalopathy  Brain lesion - R temporal lobe - s/p crani/lobectomy.  DDx in this newly dx HIV pt includes CNS lymphoma, toxoplasmosis, cryptococcus, histoplasmosis.   P:   RASS goal: -1 ID following as above  Brain bx pending  Per neurosurgery  Continue keppra, decadron  Mental status will likely be barrier  to weaning/extubation   CARDIOVASCULAR HTN  P:  Monitor  Propofol for sedation    RENAL Hyponatremia - mild  P:   F/u chem   GASTROINTESTINAL No active issue  P:   PPI  Consider TF in am if not able to extubate   HEMATOLOGIC No active issue  P:  SCD's  F/u CBC   INFECTIOUS Brain lesion  Opthalmic herpes  New dx HIV  P:   BCx2 8/8>>>1/2 coag neg staph  HIV 8/8>>> POS  Toxoplasmosis 8/10>>> Cryptococcal Ag 8/10>> NEG  RPR 8/10>>> Hepatits panel 8/8>>>  Acyclovir 8/9>>> Vancomycin 8/9>>>  ID following   ENDOCRINE No active issue  P:   Monitor glucose on chem     FAMILY  - Updates:  No family available 72/10.   - Inter-disciplinary family meet or Palliative Care  meeting due by:  8/17    Nickolas Madrid, NP 02/20/2015  4:02 PM Pager: [phone] or [phone]  Attending Note:  29 year old HIV male who presents to the hospital with AMS who presents to PCCM post op crani.  Patient not alert enough to extubate.  Will need to evaluate mental status daily to determine if patient is alert enough to extubate.  In the meantime, will check ABG and CXR and adjust vent.  Will await biopsy.  The patient is critically ill with multiple organ systems failure and requires high complexity decision making for assessment and support, frequent evaluation and titration of therapies, application of advanced monitoring technologies and extensive interpretation of multiple databases.   Critical Care Time devoted to patient care services described in this note is  35  Minutes. This time reflects time of care of this signee Dr Jennet Maduro. This critical care time does not reflect procedure time, or teaching time or supervisory time of PA/NP/Med student/Med Resident etc but could involve care discussion time.  Rush Farmer, M.D. [facility] Pulmonary/Critical Care Medicine. Pager: [phone]. After hours pager: [phone].

## 2015-02-20 NOTE — Transfer of Care (Signed)
Immediate Anesthesia Transfer of Care Note  Patient: Victor [name]  Procedure(s) Performed: Procedure(s) with comments: Right Temporal Craniotomy for Excision of Mass (Right) - Right Temporal Craniotomy for Excision of Mass  Patient Location: PACU and NICU  Anesthesia Type:General  Level of Consciousness: Patient remains intubated per anesthesia plan  Airway & Oxygen Therapy: Patient remains intubated per anesthesia plan and Patient placed on Ventilator (see vital sign flow sheet for setting)  Post-op Assessment: Report given to RN and Post -op Vital signs reviewed and stable  Post vital signs: Reviewed and stable  Last Vitals:  Filed Vitals:   02/20/15 1136  BP:   Pulse: 65  Temp:   Resp: 21    Complications: No apparent anesthesia complications

## 2015-02-20 NOTE — Anesthesia Procedure Notes (Signed)
Procedure Name: Intubation Date/Time: 02/20/2015 12:36 PM Performed by: Rush Farmer E Pre-anesthesia Checklist: Patient identified, Emergency Drugs available, Suction available, Patient being monitored and Timeout performed Patient Re-evaluated:Patient Re-evaluated prior to inductionOxygen Delivery Method: Circle system utilized Preoxygenation: Pre-oxygenation with 100% oxygen Intubation Type: IV induction, Rapid sequence and Cricoid Pressure applied Laryngoscope Size: Mac and 4 Grade View: Grade I Tube type: Oral Tube size: 7.5 mm Number of attempts: 1 Airway Equipment and Method: Stylet Placement Confirmation: ETT inserted through vocal cords under direct vision,  positive ETCO2 and breath sounds checked- equal and bilateral Secured at: 22 cm Tube secured with: Tape Dental Injury: Teeth and Oropharynx as per pre-operative assessment  Comments: AOI per Lawrence Santiago, SRNA with Dr. Kalman Shan supervising.

## 2015-02-20 NOTE — Progress Notes (Signed)
If patient is transitioned to Neuro ICU then the Critical care team is to take over patient's care. This has been discussed with ICU attending and he has requested I leave a clarifying note.  Should patient be on floor then we will continue medical management.  Velvet Bathe   (*Nursing: should patient be in ICU page critical care team for any medical need or questions)

## 2015-02-21 ENCOUNTER — Inpatient Hospital Stay (HOSPITAL_COMMUNITY): Payer: Medicaid Other

## 2015-02-21 ENCOUNTER — Encounter (HOSPITAL_COMMUNITY): Payer: Self-pay

## 2015-02-21 DIAGNOSIS — J9600 Acute respiratory failure, unspecified whether with hypoxia or hypercapnia: Secondary | ICD-10-CM | POA: Insufficient documentation

## 2015-02-21 DIAGNOSIS — D496 Neoplasm of unspecified behavior of brain: Secondary | ICD-10-CM

## 2015-02-21 LAB — TOXOPLASMA ANTIBODIES- IGG AND  IGM: Toxoplasma IgG Ratio: <3 IU/mL (ref 0.0–7.1)

## 2015-02-21 LAB — HEPATITIS PANEL, ACUTE
HEP A IGM: NEGATIVE
Hep B C IgM: NEGATIVE
Hepatitis B Surface Ag: NEGATIVE

## 2015-02-21 LAB — BASIC METABOLIC PANEL
Anion gap: 9 (ref 5–15)
BUN: 7 mg/dL (ref 6–20)
CALCIUM: 8.9 mg/dL (ref 8.9–10.3)
CO2: 24 mmol/L (ref 22–32)
Chloride: 101 mmol/L (ref 101–111)
Creatinine, Ser: 0.76 mg/dL (ref 0.61–1.24)
GLUCOSE: 151 mg/dL — AB (ref 65–99)
Potassium: 4.2 mmol/L (ref 3.5–5.1)
SODIUM: 134 mmol/L — AB (ref 135–145)

## 2015-02-21 LAB — CBC
HEMATOCRIT: 32.8 % — AB (ref 39.0–52.0)
Hemoglobin: 11 g/dL — ABNORMAL LOW (ref 13.0–17.0)
MCH: 28 pg (ref 26.0–34.0)
MCHC: 33.5 g/dL (ref 30.0–36.0)
MCV: 83.5 fL (ref 78.0–100.0)
PLATELETS: 316 10*3/uL (ref 150–400)
RBC: 3.93 MIL/uL — AB (ref 4.22–5.81)
RDW: 13.6 % (ref 11.5–15.5)
WBC: 11.9 10*3/uL — ABNORMAL HIGH (ref 4.0–10.5)

## 2015-02-21 LAB — T-HELPER CELLS (CD4) COUNT (NOT AT ARMC)
CD4 % Helper T Cell: 3 % — ABNORMAL LOW (ref 33–55)
CD4 T Cell Abs: 10 /uL — ABNORMAL LOW (ref 400–2700)

## 2015-02-21 LAB — RPR, QUANT+TP ABS (REFLEX): TREPONEMA PALLIDUM AB: POSITIVE — AB

## 2015-02-21 LAB — RPR: RPR Ser Ql: REACTIVE — AB

## 2015-02-21 MED ORDER — VITAL HIGH PROTEIN PO LIQD: 1000.0000 mL | ORAL | Status: DC

## 2015-02-21 MED ORDER — VITAL AF 1.2 CAL PO LIQD
1000.0000 mL | ORAL | Status: DC
  Filled 2015-02-21 (×2): qty 1000

## 2015-02-21 MED ORDER — ADULT MULTIVITAMIN W/MINERALS CH
1.0000 | ORAL_TABLET | Freq: Every day | ORAL | Status: DC
  Administered 2015-02-21 – 2015-02-23 (×3): 1
  Filled 2015-02-21 (×5): qty 1

## 2015-02-21 MED ORDER — MIDAZOLAM HCL 2 MG/2ML IJ SOLN: 2.0000 mg | INTRAMUSCULAR | Status: DC | PRN

## 2015-02-21 MED ORDER — OSMOLITE 1.2 CAL PO LIQD
1000.0000 mL | ORAL | Status: DC
  Administered 2015-02-21 – 2015-02-23 (×4): 1000 mL
  Filled 2015-02-21 (×8): qty 1000

## 2015-02-21 MED ORDER — FENTANYL CITRATE (PF) 100 MCG/2ML IJ SOLN: 100.0000 ug | INTRAMUSCULAR | Status: DC | PRN

## 2015-02-21 MED ORDER — PRO-STAT SUGAR FREE PO LIQD
30.0000 mL | Freq: Three times a day (TID) | ORAL | Status: DC
  Administered 2015-02-21 – 2015-02-23 (×8): 30 mL
  Filled 2015-02-21 (×13): qty 30

## 2015-02-21 MED ORDER — IOHEXOL 300 MG/ML  SOLN
75.0000 mL | Freq: Once | INTRAMUSCULAR | Status: AC | PRN
  Administered 2015-02-21: 75 mL via INTRAVENOUS

## 2015-02-21 NOTE — Progress Notes (Signed)
Initial Nutrition Assessment  DOCUMENTATION CODES:   Severe malnutrition in context of chronic illness  INTERVENTION:   Initiate Osmolite 1.2 @ 25 ml/hr via OG tube and increase by 10 ml every 8 hours to goal rate of 65 ml/hr.   30 ml Prostat TID  MVI daily  Tube feeding regimen provides 2172 kcal, 131 grams of protein, and 1279 ml of H2O.     NUTRITION DIAGNOSIS:   Malnutrition related to chronic illness as evidenced by severe depletion of body fat, severe depletion of muscle mass.   GOAL:   Patient will meet greater than or equal to 90% of their needs   MONITOR:   TF tolerance, Vent status, Labs, I & O's, Weight trends  REASON FOR ASSESSMENT:   Consult Enteral/tube feeding initiation and management  ASSESSMENT:   29yo male with no PMH except for shingles in L eye several weeks prior to admission (rx with antiviral) presented 8/8 with 2 week hx headache, weight loss and progressive lethargy and confusion. Found to have several lesions in R temporal lobe with mass effect. Seen in consultation by neurosurgery and taken 8/10 to OR for craniotomy and R temporal lobectomy. 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  Patient is currently intubated (8/10) on ventilator support MV: 10 L/min Temp (24hrs), Avg:98.9 F (37.2 C), Min:97 F (36.1 C), Max:100.4 F (38 C)  Pt discussed during ICU rounds and with RN. 0 Per RN MD suspects AIDS with lymphoma and CD4 of 60. Pt with hx of 2 weeks of weight loss and confusion.  Nutrition-Focused physical exam completed. Findings are severe fat depletion, severe muscle depletion, and no edema.   Labs reviewed: sodium low Medications reviewed and include: decadron, IVF with KCl OG tube, tip stomach  Per record review pt allergic to fish and shrimp. Little evidence in research reports that an allergic reaction to fish oil is extremely rare as the allergy is to the protein in fish/shellfish.  Diet Order:  Diet NPO time specified  Skin:  Reviewed, no  issues  Last BM:  unknown  Height:   Ht Readings from Last 1 Encounters:  02/18/15 6\' 3"  (1.905 m)    Weight:   Wt Readings from Last 1 Encounters:  02/18/15 158 lb 1.1 oz (71.7 kg)    Ideal Body Weight:  89 kg  BMI:  Body mass index is 19.76 kg/(m^2).  Estimated Nutritional Needs:   Kcal:  2142  Protein:  110-125 grams  Fluid:  > 2.1 L/day  EDUCATION NEEDS:   No education needs identified at this time  [place], [place], [place] Pager [phone] After Hours Pager

## 2015-02-21 NOTE — Progress Notes (Signed)
MD notified of decreasing heart rate from mid 70's to mid 50's, no new orders given at this time. Will continue to monitor patient closely.

## 2015-02-21 NOTE — Progress Notes (Signed)
[facility]    Subjective: intubated and opening his eyes   Antibiotics:  Anti-infectives    Start     Dose/Rate Route Frequency Ordered Stop   02/21/15 1000  dolutegravir (TIVICAY) tablet 50 mg    Comments:  Give per TUBE   50 mg Oral Daily 02/20/15 2222     02/21/15 1000  emtricitabine-tenofovir (TRUVADA) 200-300 MG per tablet 1 tablet     1 tablet Per Tube Daily 02/20/15 2222     02/21/15 1000  sulfamethoxazole-trimethoprim (BACTRIM,SEPTRA) 200-40 MG/5ML suspension 20 mL     20 mL Per Tube Daily 02/20/15 2224     02/20/15 2200  vancomycin (VANCOCIN) 1,250 mg in sodium chloride 0.9 % 250 mL IVPB  Status:  Discontinued     1,250 mg 166.7 mL/hr over 90 Minutes Intravenous Every 8 hours 02/20/15 1643 02/20/15 1801   02/20/15 1349  vancomycin (VANCOCIN) 1 GM/200ML IVPB  Status:  Discontinued    Comments:  [name]   : cabinet override      02/20/15 1349 02/20/15 1607   02/20/15 1346  vancomycin (VANCOCIN) 1 GM/200ML IVPB  Status:  Discontinued    Comments:  [name]   : cabinet override      02/20/15 1346 02/20/15 1351   02/20/15 1323  bacitracin 50,000 Units in sodium chloride irrigation 0.9 % 500 mL irrigation  Status:  Discontinued       As needed 02/20/15 1323 02/20/15 1518   02/19/15 2000  acyclovir (ZOVIRAX) 715 mg in dextrose 5 % 150 mL IVPB  Status:  Discontinued     10 mg/kg  71.7 kg 164.3 mL/hr over 60 Minutes Intravenous 3 times per day 02/19/15 1419 02/20/15 1801   02/18/15 2200  vancomycin (VANCOCIN) IVPB 1000 mg/200 mL premix  Status:  Discontinued     1,000 mg 200 mL/hr over 60 Minutes Intravenous Every 8 hours 02/18/15 1146 02/20/15 1643   02/18/15 2200  cefTRIAXone (ROCEPHIN) 2 g in dextrose 5 % 50 mL IVPB  Status:  Discontinued     2 g 100 mL/hr over 30 Minutes Intravenous Every 12 hours 02/18/15 1618 02/20/15 1535   02/18/15 1115  vancomycin (VANCOCIN) 1,750 mg in sodium chloride 0.9 % 500 mL IVPB     1,750  mg 250 mL/hr over 120 Minutes Intravenous STAT 02/18/15 1101 02/18/15 1620   02/18/15 1100  acyclovir (ZOVIRAX) 860 mg in dextrose 5 % 150 mL IVPB  Status:  Discontinued     10 mg/kg  86.2 kg 167.2 mL/hr over 60 Minutes Intravenous 3 times per day 02/18/15 1030 02/19/15 1419   02/18/15 1045  vancomycin (VANCOCIN) IVPB 1000 mg/200 mL premix  Status:  Discontinued     1,000 mg 200 mL/hr over 60 Minutes Intravenous  Once 02/18/15 1030 02/18/15 1101   02/18/15 1045  cefTRIAXone (ROCEPHIN) 2 g in dextrose 5 % 50 mL IVPB  Status:  Discontinued     2 g 100 mL/hr over 30 Minutes Intravenous Every 12 hours 02/18/15 1030 02/18/15 1618      Medications: Scheduled Meds: . antiseptic oral rinse  7 mL Mouth Rinse QID  . chlorhexidine gluconate  15 mL Mouth Rinse BID  . dexamethasone  4 mg Intravenous 4 times per day   Followed by  . [START ON 02/22/2015] dexamethasone  4 mg Intravenous 3 times per day  . dolutegravir  50 mg Oral Daily  . emtricitabine-tenofovir  1 tablet Per Tube  Daily  . feeding supplement (PRO-STAT SUGAR FREE 64)  30 mL Per Tube TID  . levETIRAcetam  500 mg Intravenous Q12H  . multivitamin with minerals  1 tablet Per Tube Daily  . pantoprazole (PROTONIX) IV  40 mg Intravenous QHS  . sulfamethoxazole-trimethoprim  20 mL Per Tube Daily   Continuous Infusions: . dextrose 5 % and 0.45 % NaCl with KCl 10 mEq/L 1,000 mL (02/21/15 0605)  . feeding supplement (OSMOLITE 1.2 CAL)     PRN Meds:.fentaNYL (SUBLIMAZE) injection, fentaNYL (SUBLIMAZE) injection, labetalol, midazolam, midazolam, naLOXone (NARCAN)  injection, ondansetron **OR** ondansetron (ZOFRAN) IV, promethazine    Objective: Weight change:   Intake/Output Summary (Last 24 hours) at 02/21/15 1949 Last data filed at 02/21/15 1900  Gross per 24 hour  Intake 2128.05 ml  Output   2350 ml  Net -221.95 ml   Blood pressure 129/73, pulse 61, temperature 98.8 F (37.1 C), temperature source Oral, resp. rate 12, height 6'  3" (1.905 m), weight 158 lb 1.1 oz (71.7 kg), SpO2 99 %. Temp:  [98.5 F (36.9 C)-100.4 F (38 C)] 98.8 F (37.1 C) (08/11 1611) Pulse Rate:  [56-116] 61 (08/11 1918) Resp:  [12-27] 12 (08/11 1918) BP: (103-133)/(59-83) 129/73 mmHg (08/11 1918) SpO2:  [94 %-100 %] 99 % (08/11 1918) Arterial Line BP: (102-168)/(53-79) 168/70 mmHg (08/11 1900) FiO2 (%):  [40 %-50 %] 40 % (08/11 1918)  Physical Exam: General: intubated with dressing in place HEENT: anicteric sclera,  [employer] bradycardic, normal r,  no murmur rubs or gallops Chest: clear to auscultation bilaterally, no wheezing, rales or rhonchi Abdomen: soft  nondistended, normal bowel sounds, Extremities: no  clubbing or edema noted bilaterally Skin: no rashes Neuro: nonfocal  CBC: CBC Latest Ref Rng 02/21/2015 02/20/2015 02/20/2015  WBC 4.0 - 10.5 K/uL 11.9(H) 4.7 6.2  Hemoglobin 13.0 - 17.0 g/dL 11.0(L) 11.6(L) 11.1(L)  Hematocrit 39.0 - 52.0 % 32.8(L) 34.9(L) 32.9(L)  Platelets 150 - 400 K/uL 316 349 311       BMET  Recent Labs  02/20/15 1600 02/21/15 0300  NA 133* 134*  K 3.7 4.2  CL 102 101  CO2 22 24  GLUCOSE 122* 151*  BUN 10 7  CREATININE 0.74 0.76  CALCIUM 8.7* 8.9     Liver Panel  No results for input(s): PROT, ALBUMIN, AST, ALT, ALKPHOS, BILITOT, BILIDIR, IBILI in the last 72 hours.     Sedimentation Rate No results for input(s): ESRSEDRATE in the last 72 hours. C-Reactive Protein No results for input(s): CRP in the last 72 hours.  Micro Results: Recent Results (from the past 720 hour(s))  Culture, blood (routine x 2)     Status: None (Preliminary result)   Collection Time: 02/18/15 11:15 AM  Result Value Ref Range Status   Specimen Description BLOOD RIGHT ANTECUBITAL  Final   Special Requests BOTTLES DRAWN AEROBIC AND ANAEROBIC 5ML  Final   Culture  Setup Time   Final    GRAM POSITIVE COCCI IN CLUSTERS IN BOTH AEROBIC AND ANAEROBIC BOTTLES CRITICAL RESULT CALLED TO, READ BACK BY AND VERIFIED  WITH: R. CLARK,RN AT 1962 ON [id number] BY Rhea Bleacher    Culture   Final    STAPHYLOCOCCUS SPECIES (COAGULASE NEGATIVE) THE SIGNIFICANCE OF ISOLATING THIS ORGANISM FROM A SINGLE SET OF BLOOD CULTURES WHEN MULTIPLE SETS ARE DRAWN IS UNCERTAIN. PLEASE NOTIFY THE MICROBIOLOGY DEPARTMENT WITHIN ONE WEEK IF SPECIATION AND SENSITIVITIES ARE REQUIRED. Performed at [facility]    Report Status PENDING  Incomplete  Culture, blood (routine x 2)     Status: None (Preliminary result)   Collection Time: 02/18/15 11:23 AM  Result Value Ref Range Status   Specimen Description BLOOD LEFT ANTECUBITAL  Final   Special Requests BOTTLES DRAWN AEROBIC AND ANAEROBIC 5ML  Final   Culture   Final    NO GROWTH 3 DAYS Performed at [facility]    Report Status PENDING  Incomplete  MRSA PCR Screening     Status: None   Collection Time: 02/19/15 12:02 AM  Result Value Ref Range Status   MRSA by PCR NEGATIVE NEGATIVE Final    Comment:        The GeneXpert MRSA Assay (FDA approved for NASAL specimens only), is one component of a comprehensive MRSA colonization surveillance program. It is not intended to diagnose MRSA infection nor to guide or monitor treatment for MRSA infections.     Studies/Results: Ct Head W Wo Contrast  02/21/2015   CLINICAL DATA:  RIGHT temporal lobectomy for brain tumor performed yesterday. History of AIDS.  EXAM: CT HEAD WITHOUT AND WITH CONTRAST  TECHNIQUE: Contiguous axial images were obtained from the base of the skull through the vertex without and with intravenous contrast  CONTRAST:  62mL OMNIPAQUE IOHEXOL 300 MG/ML  SOLN  COMPARISON:  MRI of the brain February 18, 2015  FINDINGS: Interval RIGHT frontotemporal craniotomy for resection of temporal lobe mass. Vasogenic and possible component of cytotoxic edema extending to the subinsular white matter with small peripheral temporal lobe resection cavity with air. Residual fell by 16 mm enhancing mass along the superior  margin of the resection cavity. In addition, residual 15 x 17 mm enhancing mass posterior to the resection cavity. Abnormal enhancement RIGHT sylvian fissure correspond to prior MR abnormality most consistent with vascular involvement. 7 mm RIGHT to LEFT midline shift is similar with partially effaced RIGHT lateral ventricle, no entrapment or hydrocephalus. Small amount of RIGHT frontotemporal extra-axial pneumocephalus.  Density basal ganglia as seen on prior examination, with nodular abnormal enhancement within the RIGHT basal ganglia. Mildly effaced RIGHT basal cistern. No acute large vascular territory infarct.  Ocular globes and orbital contents are unremarkable. Moderate low paranasal sinusitis with air-fluid level. Moderate LEFT mastoid effusion. Life-support lines in place.  IMPRESSION: Interval RIGHT frontal temporal craniotomy for debulking of RIGHT temporal lobe mass with residual 12 x 16 mm component along the superior margin of the resection cavity. In addition, residual 15 x 17 mm RIGHT temporal lobe nodule. Extensive surrounding edema, with similar 7 mm RIGHT to LEFT mass effect.  Abnormally dense basal ganglia with superimposed nodular enhancement RIGHT basal ganglia, findings compatible with primary CNS lymphoma.   Electronically Signed   By: Elon Alas M.D.   On: 02/21/2015 04:44   Dg Chest Portable 1 View  02/20/2015   CLINICAL DATA:  Respiratory failure, intubated patient, encephalopathy, brain mass, fever, previous smoker  EXAM: PORTABLE CHEST - 1 VIEW  COMPARISON:  None  FINDINGS: The lungs are well-expanded. There is increased density in the retrocardiac region on the left. The endotracheal tube tip lies 4.8 cm above the carina. The heart and pulmonary vascularity are normal. The bony thorax is unremarkable.  IMPRESSION: Left lower lobe atelectasis or early pneumonia. The endotracheal tube is in reasonable position.   Electronically Signed   By: David  Martinique M.D.   On: 02/20/2015  16:39   Dg Abd Portable 1v  02/21/2015   CLINICAL DATA:  Assess orogastric tube positioning  EXAM: PORTABLE ABDOMEN -  1 VIEW  COMPARISON:  None in PACs  FINDINGS: The orogastric tube tip projects in the region of the pylorus with the proximal port in the gastric body. The bowel gas pattern is unremarkable. The lung bases are clear.  IMPRESSION: Interval placement of the orogastric tube with positioning of the tip and proximal port within the stomach.   Electronically Signed   By: David  Martinique M.D.   On: 02/21/2015 14:45      Assessment/Plan:  Active Problems:   Acute encephalopathy   Brain mass   Vasogenic brain edema   Severe protein-calorie malnutrition   Fever   Altered mental status   AIDS   Brain tumor   Respiratory failure    Victor [name] is a 29 y.o. 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

## 2015-02-21 NOTE — Progress Notes (Signed)
Patient ID: Victor [name], male   DOB: 09-Nov-1985, 29 y.o.   MRN: [id number] Subjective: Patient reports Non communicative  Objective: Vital signs in last 24 hours: Temp:  [98.5 F (36.9 C)-100.4 F (38 C)] 98.8 F (37.1 C) (08/11 1611) Pulse Rate:  [56-116] 64 (08/11 1800) Resp:  [12-27] 24 (08/11 1800) BP: (103-133)/(59-83) 116/71 mmHg (08/11 1800) SpO2:  [94 %-100 %] 97 % (08/11 1800) Arterial Line BP: (102-150)/(53-79) 150/66 mmHg (08/11 1800) FiO2 (%):  [40 %-50 %] 40 % (08/11 1615)  Intake/Output from previous day: 08/10 0701 - 08/11 0700 In: 3508.6 [I.V.:3303.6; IV Piggyback:205] Out: 2575 [Urine:2425; Blood:150] Intake/Output this shift:    opens eyes occasionally; does not follow commands  Lab Results:  Recent Labs  02/20/15 1600 02/21/15 0300  WBC 4.7 11.9*  HGB 11.6* 11.0*  HCT 34.9* 32.8*  PLT 349 316   BMET  Recent Labs  02/20/15 1600 02/21/15 0300  NA 133* 134*  K 3.7 4.2  CL 102 101  CO2 22 24  GLUCOSE 122* 151*  BUN 10 7  CREATININE 0.74 0.76  CALCIUM 8.7* 8.9    Studies/Results: Ct Head W Wo Contrast  02/21/2015   CLINICAL DATA:  RIGHT temporal lobectomy for brain tumor performed yesterday. History of AIDS.  EXAM: CT HEAD WITHOUT AND WITH CONTRAST  TECHNIQUE: Contiguous axial images were obtained from the base of the skull through the vertex without and with intravenous contrast  CONTRAST:  54mL OMNIPAQUE IOHEXOL 300 MG/ML  SOLN  COMPARISON:  MRI of the brain February 18, 2015  FINDINGS: Interval RIGHT frontotemporal craniotomy for resection of temporal lobe mass. Vasogenic and possible component of cytotoxic edema extending to the subinsular white matter with small peripheral temporal lobe resection cavity with air. Residual fell by 16 mm enhancing mass along the superior margin of the resection cavity. In addition, residual 15 x 17 mm enhancing mass posterior to the resection cavity. Abnormal enhancement RIGHT sylvian fissure correspond to  prior MR abnormality most consistent with vascular involvement. 7 mm RIGHT to LEFT midline shift is similar with partially effaced RIGHT lateral ventricle, no entrapment or hydrocephalus. Small amount of RIGHT frontotemporal extra-axial pneumocephalus.  Density basal ganglia as seen on prior examination, with nodular abnormal enhancement within the RIGHT basal ganglia. Mildly effaced RIGHT basal cistern. No acute large vascular territory infarct.  Ocular globes and orbital contents are unremarkable. Moderate low paranasal sinusitis with air-fluid level. Moderate LEFT mastoid effusion. Life-support lines in place.  IMPRESSION: Interval RIGHT frontal temporal craniotomy for debulking of RIGHT temporal lobe mass with residual 12 x 16 mm component along the superior margin of the resection cavity. In addition, residual 15 x 17 mm RIGHT temporal lobe nodule. Extensive surrounding edema, with similar 7 mm RIGHT to LEFT mass effect.  Abnormally dense basal ganglia with superimposed nodular enhancement RIGHT basal ganglia, findings compatible with primary CNS lymphoma.   Electronically Signed   By: Elon Alas M.D.   On: 02/21/2015 04:44   Dg Chest Portable 1 View  02/20/2015   CLINICAL DATA:  Respiratory failure, intubated patient, encephalopathy, brain mass, fever, previous smoker  EXAM: PORTABLE CHEST - 1 VIEW  COMPARISON:  None  FINDINGS: The lungs are well-expanded. There is increased density in the retrocardiac region on the left. The endotracheal tube tip lies 4.8 cm above the carina. The heart and pulmonary vascularity are normal. The bony thorax is unremarkable.  IMPRESSION: Left lower lobe atelectasis or early pneumonia. The endotracheal tube is in reasonable  position.   Electronically Signed   By: David  Martinique M.D.   On: 02/20/2015 16:39   Dg Abd Portable 1v  02/21/2015   CLINICAL DATA:  Assess orogastric tube positioning  EXAM: PORTABLE ABDOMEN - 1 VIEW  COMPARISON:  None in PACs  FINDINGS: The  orogastric tube tip projects in the region of the pylorus with the proximal port in the gastric body. The bowel gas pattern is unremarkable. The lung bases are clear.  IMPRESSION: Interval placement of the orogastric tube with positioning of the tip and proximal port within the stomach.   Electronically Signed   By: David  Martinique M.D.   On: 02/21/2015 14:45    Assessment/Plan: CT today shows less mass effect with removal of anterior temporal lobe; the path is pending; he has not really improved post surgery; will continue present treatment and see if he starts to improve; the process in his brain is very diffuse, and hard to say if he will get better or not   LOS: 3 days  as above   Faythe Ghee, MD 02/21/2015, 7:11 PM

## 2015-02-21 NOTE — Progress Notes (Addendum)
PULMONARY / CRITICAL CARE MEDICINE   Name: Camara Rosander MRN: [id number] DOB: 1986/06/23    ADMISSION DATE:  02/18/2015 CONSULTATION DATE:  8/10  REFERRING MD :  Kritzer  CHIEF COMPLAINT:  Vent management   INITIAL PRESENTATION:  29yo male with no PMH except for shingles in L eye several weeks prior to admission (rx with antiviral) presented 8/8 with 2 week hx headache, weight loss and progressive lethargy and confusion.  Found to have several lesions in R temporal lobe with mass effect.  Seen in consultation by neurosurgery and taken 8/10 to OR for craniotomy and R temporal lobectomy.  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 PCCM consulted post op for vent management.   STUDIES:  MR brain 8/8>>> Markedly abnormal brain with multifocal mostly intermediate to low T2 signal masslike lesions with enhancement and edema. Right greater than left hemisphere involvement, also with nonenhancing signal abnormality tracking along the periaqueductal gray matter and fourth ventricle.   Intracranial mass effect with leftward midline shift of 8 mm and ventricular effacement. Basilar cisterns remain patent. No ventriculomegaly. 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    SIGNIFICANT EVENTS: 8/10>> OR for crani, R temporal lobectomy   HISTORY OF PRESENT ILLNESS:  29yo male with no PMH except for shingles in L eye several weeks prior to admission (rx with antiviral) presented 8/8 with 2 week hx headache, weight loss and progressive lethargy and confusion.  Found to have several lesions in R temporal lobe with mass effect.  Seen in consultation by neurosurgery and taken 8/10 to OR for craniotomy and R temporal lobectomy.  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 PCCM consulted post op for vent management.    SUBJECTIVE / Interval hx:  Pathology pending on brain mass, initial stains consistent w neoplasm Appreciate ID  consult  VITAL SIGNS: Temp:  [97 F (36.1 C)-100.4 F (38 C)] 98.5 F (36.9 C) (08/11 0800) Pulse Rate:  [63-116] 72 (08/11 0600) Resp:  [12-21] 12 (08/11 0600) BP: (103-133)/(59-105) 103/65 mmHg (08/11 0600) SpO2:  [94 %-100 %] 98 % (08/11 0600) Arterial Line BP: (112-148)/(53-79) 112/57 mmHg (08/11 0600) FiO2 (%):  [40 %-50 %] 40 % (08/11 0600) HEMODYNAMICS:   VENTILATOR SETTINGS: Vent Mode:  [-] PRVC FiO2 (%):  [40 %-50 %] 40 % Set Rate:  [12 bmp] 12 bmp Vt Set:  [650 mL] 650 mL PEEP:  [5 cmH20] 5 cmH20 Plateau Pressure:  [11 cmH20-22 cmH20] 12 cmH20 INTAKE / OUTPUT:  Intake/Output Summary (Last 24 hours) at 02/21/15 0835 Last data filed at 02/21/15 0600  Gross per 24 hour  Intake 3349.27 ml  Output   2575 ml  Net 774.27 ml    PHYSICAL EXAMINATION: General:  Thin young male, NAD on vent  Neuro:  RASS -1 off sedation HEENT:  Mm moist, ETT Cardiovascular:  s1s2 rrr Lungs:  resps even non labored on vent, coarse  Abdomen:  Soft, +bs  Musculoskeletal:  Warm and dry, no edema   LABS:  CBC  Recent Labs Lab 02/20/15 0435 02/20/15 1600 02/21/15 0300  WBC 6.2 4.7 11.9*  HGB 11.1* 11.6* 11.0*  HCT 32.9* 34.9* 32.8*  PLT 311 349 316   Coag's  Recent Labs Lab 02/18/15 1502  APTT 36  INR 1.24   BMET  Recent Labs Lab 02/19/15 1333 02/20/15 1600 02/21/15 0300  NA 134* 133* 134*  K 4.4 3.7 4.2  CL 101  102 101  CO2 22 22 24   BUN 12 10 7   CREATININE 0.75 0.74 0.76  GLUCOSE 129* 122* 151*   Electrolytes  Recent Labs Lab 02/19/15 1333 02/20/15 1600 02/21/15 0300  CALCIUM 8.8* 8.7* 8.9   Sepsis Markers  Recent Labs Lab 02/18/15 1500 02/19/15 1333 02/19/15 1613  LATICACIDVEN 1.2 1.0 1.0   ABG No results for input(s): PHART, PCO2ART, PO2ART in the last 168 hours. Liver Enzymes No results for input(s): AST, ALT, ALKPHOS, BILITOT, ALBUMIN in the last 168 hours. Cardiac Enzymes No results for input(s): TROPONINI, PROBNP in the last 168  hours. Glucose No results for input(s): GLUCAP in the last 168 hours.  Imaging Ct Head W Wo Contrast  02/21/2015   CLINICAL DATA:  RIGHT temporal lobectomy for brain tumor performed yesterday. History of AIDS.  EXAM: CT HEAD WITHOUT AND WITH CONTRAST  TECHNIQUE: Contiguous axial images were obtained from the base of the skull through the vertex without and with intravenous contrast  CONTRAST:  50mL OMNIPAQUE IOHEXOL 300 MG/ML  SOLN  COMPARISON:  MRI of the brain February 18, 2015  FINDINGS: Interval RIGHT frontotemporal craniotomy for resection of temporal lobe mass. Vasogenic and possible component of cytotoxic edema extending to the subinsular white matter with small peripheral temporal lobe resection cavity with air. Residual fell by 16 mm enhancing mass along the superior margin of the resection cavity. In addition, residual 15 x 17 mm enhancing mass posterior to the resection cavity. Abnormal enhancement RIGHT sylvian fissure correspond to prior MR abnormality most consistent with vascular involvement. 7 mm RIGHT to LEFT midline shift is similar with partially effaced RIGHT lateral ventricle, no entrapment or hydrocephalus. Small amount of RIGHT frontotemporal extra-axial pneumocephalus.  Density basal ganglia as seen on prior examination, with nodular abnormal enhancement within the RIGHT basal ganglia. Mildly effaced RIGHT basal cistern. No acute large vascular territory infarct.  Ocular globes and orbital contents are unremarkable. Moderate low paranasal sinusitis with air-fluid level. Moderate LEFT mastoid effusion. Life-support lines in place.  IMPRESSION: Interval RIGHT frontal temporal craniotomy for debulking of RIGHT temporal lobe mass with residual 12 x 16 mm component along the superior margin of the resection cavity. In addition, residual 15 x 17 mm RIGHT temporal lobe nodule. Extensive surrounding edema, with similar 7 mm RIGHT to LEFT mass effect.  Abnormally dense basal ganglia with  superimposed nodular enhancement RIGHT basal ganglia, findings compatible with primary CNS lymphoma.   Electronically Signed   By: Elon Alas M.D.   On: 02/21/2015 04:44   Dg Chest Portable 1 View  02/20/2015   CLINICAL DATA:  Respiratory failure, intubated patient, encephalopathy, brain mass, fever, previous smoker  EXAM: PORTABLE CHEST - 1 VIEW  COMPARISON:  None  FINDINGS: The lungs are well-expanded. There is increased density in the retrocardiac region on the left. The endotracheal tube tip lies 4.8 cm above the carina. The heart and pulmonary vascularity are normal. The bony thorax is unremarkable.  IMPRESSION: Left lower lobe atelectasis or early pneumonia. The endotracheal tube is in reasonable position.   Electronically Signed   By: David  Martinique M.D.   On: 02/20/2015 16:39     ASSESSMENT / PLAN:  PULMONARY OETT 8/10>>> Acute respiratory failure - post op neurosurgery  P:   Vent support - 8cc/kg  Follow CXR  Daily WUA and SBT Suspect mental status will remain barrier to weaning - lethargy is what prompted admission and it seems as though his mental status has remained marginal since.  NEUROLOGIC Acute encephalopathy  Brain lesion - R temporal lobe - s/p crani/lobectomy.  DDx in this newly dx HIV pt includes CNS lymphoma, toxoplasmosis, cryptococcus, histoplasmosis.   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 Prophylaxis and ART as above. Will also need weekly azithromycin  ENDOCRINE No active issue  P:   Monitor glucose on chem     FAMILY  - Updates:  Dr Tommy Medal discussed dx and status with parents on 8/10. Dr Lamonte Sakai updated parents at bedside 8/11  - Inter-disciplinary family meet or Palliative Care meeting due by:  8/17   Independent CC time 35 minutes.    Baltazar Apo, MD, PhD 02/21/2015, 8:55 AM [facility] [phone] or if no answer [phone]

## 2015-02-21 NOTE — Progress Notes (Signed)
MD notified of continual decrease in heart rate. Pt. Asymptomatic. No new orders at this time. Will continue to monitor closely.

## 2015-02-21 NOTE — Anesthesia Postprocedure Evaluation (Signed)
  Anesthesia Post-op Note  Patient: Victor [name]  Procedure(s) Performed: Procedure(s) (LRB): Right Temporal Craniotomy for Excision of Mass (Right)  Patient Location: icu  Anesthesia Type: General  Level of Consciousness: sedated  Airway and Oxygen Therapy: Patient intubated  Post-op Pain: mild  Post-op Assessment: Post-op Vital signs reviewed, Patient's Cardiovascular Status Stable, Respiratory Function Stable, Patent Airway and No signs of Nausea or vomiting  Last Vitals:  Filed Vitals:   02/21/15 0800  BP: 108/66  Pulse: 67  Temp: 36.9 C  Resp: 12    Post-op Vital Signs: stable   Complications: No apparent anesthesia complications

## 2015-02-22 ENCOUNTER — Inpatient Hospital Stay (HOSPITAL_COMMUNITY): Payer: Medicaid Other

## 2015-02-22 LAB — GLUCOSE, CAPILLARY
GLUCOSE-CAPILLARY: 138 mg/dL — AB (ref 65–99)
GLUCOSE-CAPILLARY: 126 mg/dL — AB (ref 65–99)
Glucose-Capillary: 142 mg/dL — ABNORMAL HIGH (ref 65–99)
Glucose-Capillary: 137 mg/dL — ABNORMAL HIGH (ref 65–99)
Glucose-Capillary: 140 mg/dL — ABNORMAL HIGH (ref 65–99)
Glucose-Capillary: 131 mg/dL — ABNORMAL HIGH (ref 65–99)
Glucose-Capillary: 115 mg/dL — ABNORMAL HIGH (ref 65–99)

## 2015-02-22 LAB — CULTURE, BLOOD (ROUTINE X 2)

## 2015-02-22 LAB — BASIC METABOLIC PANEL
Anion gap: 8 (ref 5–15)
BUN: 8 mg/dL (ref 6–20)
CO2: 25 mmol/L (ref 22–32)
Calcium: 9 mg/dL (ref 8.9–10.3)
Chloride: 101 mmol/L (ref 101–111)
Creatinine, Ser: 0.72 mg/dL (ref 0.61–1.24)
GFR calc Af Amer: >60 mL/min (ref >60)
Glucose, Bld: 153 mg/dL — ABNORMAL HIGH (ref 65–99)
POTASSIUM: 4 mmol/L (ref 3.5–5.1)
SODIUM: 134 mmol/L — AB (ref 135–145)

## 2015-02-22 LAB — MAGNESIUM: Magnesium: 2.1 mg/dL (ref 1.7–2.4)

## 2015-02-22 LAB — CBC
HEMATOCRIT: 29.6 % — AB (ref 39.0–52.0)
Hemoglobin: 9.9 g/dL — ABNORMAL LOW (ref 13.0–17.0)
MCH: 27.4 pg (ref 26.0–34.0)
MCHC: 33.4 g/dL (ref 30.0–36.0)
MCV: 82 fL (ref 78.0–100.0)
Platelets: 319 10*3/uL (ref 150–400)
RBC: 3.61 MIL/uL — ABNORMAL LOW (ref 4.22–5.81)
RDW: 13.6 % (ref 11.5–15.5)
WBC: 8.2 10*3/uL (ref 4.0–10.5)

## 2015-02-22 LAB — PHOSPHORUS: Phosphorus: 2.9 mg/dL (ref 2.5–4.6)

## 2015-02-22 NOTE — Progress Notes (Signed)
Patient ID: Victor [name], male   DOB: [birth year], 29 y.o.   MRN: [id number] Afeb, vss No new neuro issues Seems a bit more alert today, and reportedly followed a few commands but not for me. Will continue present rx.

## 2015-02-22 NOTE — Care Management Note (Signed)
Case Management Note  Patient Details  Name: Demareon Coldwell MRN: [id number] Date of Birth: 03-02-1986  Subjective/Objective:    Pt admitted on 02/18/15 with brain mass and new HIV/AIDS dx.  PTA, pt independent of ADLS.                Action/Plan: Pt remains sedated and on ventilator at this time.  Will continue to follow for discharge planning.    Expected Discharge Date:   (unknown)               Expected Discharge Plan:  IP Rehab Facility  In-House Referral:  Clinical Social Work  Discharge planning Services  CM Consult  Post Acute Care Choice:    Choice offered to:     DME Arranged:    DME Agency:     HH Arranged:    [place] Agency:     Status of Service:  In process, will continue to follow  Medicare Important Message Given:    Date Medicare IM Given:    Medicare IM give by:    Date Additional Medicare IM Given:    Additional Medicare Important Message give by:     If discussed at [place] of Stay Meetings, dates discussed:    Additional Comments:  Reinaldo Raddle, RN, BSN  Trauma/Neuro ICU Case Manager [phone]

## 2015-02-22 NOTE — Progress Notes (Signed)
PULMONARY / CRITICAL CARE MEDICINE   Name: Victor [name] MRN: [id number] DOB: 1985-10-17    ADMISSION DATE:  02/18/2015 CONSULTATION DATE:  8/10  REFERRING MD :  Kritzer  CHIEF COMPLAINT:  Vent management   INITIAL PRESENTATION:  29yo male with no PMH except for shingles in L eye several weeks prior to admission (rx with antiviral) presented 8/8 with 2 week hx headache, weight loss and progressive lethargy and confusion.  Found to have several lesions in R temporal lobe with mass effect.  Seen in consultation by neurosurgery and taken 8/10 to OR for craniotomy and R temporal lobectomy.  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 PCCM consulted post op for vent management.   STUDIES:  MR brain 8/8>>> Markedly abnormal brain with multifocal mostly intermediate to low T2 signal masslike lesions with enhancement and edema. Right greater than left hemisphere involvement, also with nonenhancing signal abnormality tracking along the periaqueductal gray matter and fourth ventricle.   Intracranial mass effect with leftward midline shift of 8 mm and ventricular effacement. Basilar cisterns remain patent. No ventriculomegaly. 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    SIGNIFICANT EVENTS: 8/10>> OR for crani, R temporal lobectomy 02/21/15: Pathology pending on brain mass, initial stains consistent w neoplasm Appreciate ID consult   SUBJECTIVE/OVERNIGHT/INTERVAL HX 02/22/15: Per RN - moves Rt side  well but LUE/LLE - weak purposefully but not awake enough and will not follow commands. Gag + , Cough +. Path still pending. Not on pressors. No fever. Not on sedation gtt. Not needed prn sedation  VITAL SIGNS: Temp:  [97.5 F (36.4 C)-99.4 F (37.4 C)] 99.4 F (37.4 C) (08/12 0800) Pulse Rate:  [52-95] 74 (08/12 1000) Resp:  [12-26] 25 (08/12 1000) BP: (98-133)/(60-85) 120/75 mmHg (08/12 1000) SpO2:   [97 %-100 %] 100 % (08/12 1000) Arterial Line BP: (106-169)/(58-77) 141/73 mmHg (08/12 0800) FiO2 (%):  [40 %] 40 % (08/12 0858) Weight:  [74.2 kg (163 lb 9.3 oz)] 74.2 kg (163 lb 9.3 oz) (08/12 0400) HEMODYNAMICS:   VENTILATOR SETTINGS: Vent Mode:  [-] PRVC FiO2 (%):  [40 %] 40 % Set Rate:  [12 bmp] 12 bmp Vt Set:  [650 mL] 650 mL PEEP:  [5 cmH20] 5 cmH20 Pressure Support:  [8 cmH20] 8 cmH20 Plateau Pressure:  [10 cmH20-16 cmH20] 16 cmH20 INTAKE / OUTPUT:  Intake/Output Summary (Last 24 hours) at 02/22/15 1013 Last data filed at 02/22/15 0800  Gross per 24 hour  Intake 2177.92 ml  Output   2195 ml  Net -17.08 ml    PHYSICAL EXAMINATION: General:  Thin young male, NAD on vent  Neuro:  RASS -1 off sedation HEENT:  Mm moist, ETT Cardiovascular:  s1s2 rrr Lungs:  resps even non labored on vent, coarse  Abdomen:  Soft, +bs  Musculoskeletal:  Warm and dry, no edema   LABS:  PULMONARY No results for input(s): PHART, PCO2ART, PO2ART, HCO3, TCO2, O2SAT in the last 168 hours.  Invalid input(s): PCO2, PO2  CBC  Recent Labs Lab 02/20/15 1600 02/21/15 0300 02/22/15 0515  HGB 11.6* 11.0* 9.9*  HCT 34.9* 32.8* 29.6*  WBC 4.7 11.9* 8.2  PLT 349 316 319    COAGULATION  Recent Labs Lab 02/18/15 1502  INR 1.24    CARDIAC  No results for input(s): TROPONINI in the last 168 hours. No results for input(s): PROBNP in the last 168 hours.  CHEMISTRY  Recent Labs Lab 02/18/15 1036 02/19/15 1333 02/20/15 1600 02/21/15 0300 02/22/15 0515  NA 136 134* 133* 134* 134*  K 4.1 4.4 3.7 4.2 4.0  CL 96* 101 102 101 101  CO2 28 22 22 24 25   GLUCOSE 118* 129* 122* 151* 153*  BUN 23* 12 10 7 8   CREATININE 1.02 0.75 0.74 0.76 0.72  CALCIUM 10.1 8.8* 8.7* 8.9 9.0  MG  --   --   --   --  2.1  PHOS  --   --   --   --  2.9   Estimated Creatinine Clearance: 143 mL/min (by C-G formula based on Cr of 0.72).   LIVER  Recent Labs Lab 02/18/15 1502  INR 1.24      INFECTIOUS  Recent Labs Lab 02/18/15 1500 02/19/15 1333 02/19/15 1613  LATICACIDVEN 1.2 1.0 1.0     ENDOCRINE CBG (last 3)   Recent Labs  02/21/15 2328 02/22/15 0355 02/22/15 0754  GLUCAP 142* 137* 138*         IMAGING x48h  - image(s) personally visualized  -   highlighted in bold Ct Head W Wo Contrast  02/21/2015   CLINICAL DATA:  RIGHT temporal lobectomy for brain tumor performed yesterday. History of AIDS.  EXAM: CT HEAD WITHOUT AND WITH CONTRAST  TECHNIQUE: Contiguous axial images were obtained from the base of the skull through the vertex without and with intravenous contrast  CONTRAST:  61mL OMNIPAQUE IOHEXOL 300 MG/ML  SOLN  COMPARISON:  MRI of the brain February 18, 2015  FINDINGS: Interval RIGHT frontotemporal craniotomy for resection of temporal lobe mass. Vasogenic and possible component of cytotoxic edema extending to the subinsular white matter with small peripheral temporal lobe resection cavity with air. Residual fell by 16 mm enhancing mass along the superior margin of the resection cavity. In addition, residual 15 x 17 mm enhancing mass posterior to the resection cavity. Abnormal enhancement RIGHT sylvian fissure correspond to prior MR abnormality most consistent with vascular involvement. 7 mm RIGHT to LEFT midline shift is similar with partially effaced RIGHT lateral ventricle, no entrapment or hydrocephalus. Small amount of RIGHT frontotemporal extra-axial pneumocephalus.  Density basal ganglia as seen on prior examination, with nodular abnormal enhancement within the RIGHT basal ganglia. Mildly effaced RIGHT basal cistern. No acute large vascular territory infarct.  Ocular globes and orbital contents are unremarkable. Moderate low paranasal sinusitis with air-fluid level. Moderate LEFT mastoid effusion. Life-support lines in place.  IMPRESSION: Interval RIGHT frontal temporal craniotomy for debulking of RIGHT temporal lobe mass with residual 12 x 16 mm  component along the superior margin of the resection cavity. In addition, residual 15 x 17 mm RIGHT temporal lobe nodule. Extensive surrounding edema, with similar 7 mm RIGHT to LEFT mass effect.  Abnormally dense basal ganglia with superimposed nodular enhancement RIGHT basal ganglia, findings compatible with primary CNS lymphoma.   Electronically Signed   By: Elon Alas M.D.   On: 02/21/2015 04:44   Dg Chest Port 1 View  02/22/2015   CLINICAL DATA:  Acute respiratory failure  EXAM: PORTABLE CHEST - 1 VIEW  COMPARISON:  Portable chest x-ray of February 20, 2015  FINDINGS: The lungs are well-expanded. Decreased conspicuity of airspace opacity in the left lower lobe is noted today. There is no pleural effusion or pneumothorax. The heart and pulmonary vascularity are normal. The endotracheal tube tip lies 4.5 cm above the carina. The esophagogastric tube tip projects below the inferior margin of the image.  IMPRESSION: Slight interval improvement in left basilar atelectasis or pneumonia. The support tubes are in reasonable position.   Electronically Signed   By: David  Martinique M.D.   On: 02/22/2015 07:46   Dg Chest Portable 1 View  02/20/2015   CLINICAL DATA:  Respiratory failure, intubated patient, encephalopathy, brain mass, fever, previous smoker  EXAM: PORTABLE CHEST - 1 VIEW  COMPARISON:  None  FINDINGS: The lungs are well-expanded. There is increased density in the retrocardiac region on the left. The endotracheal tube tip lies 4.8 cm above the carina. The heart and pulmonary vascularity are normal. The bony thorax is unremarkable.  IMPRESSION: Left lower lobe atelectasis or early pneumonia. The endotracheal tube is in reasonable position.   Electronically Signed   By: David  Martinique M.D.   On: 02/20/2015 16:39   Dg Abd Portable 1v  02/21/2015   CLINICAL DATA:  Assess orogastric tube positioning  EXAM: PORTABLE ABDOMEN - 1 VIEW  COMPARISON:  None in PACs  FINDINGS: The orogastric tube tip projects in  the region of the pylorus with the proximal port in the gastric body. The bowel gas pattern is unremarkable. The lung bases are clear.  IMPRESSION: Interval placement of the orogastric tube with positioning of the tip and proximal port within the stomach.   Electronically Signed   By: David  Martinique M.D.   On: 02/21/2015 14:45          ASSESSMENT / PLAN:  PULMONARY OETT 8/10>>> Acute respiratory failure - post op neurosurgery    - not awake enough for extubation  P:   Vent support - 8cc/kg  Follow CXR  Daily WUA and SBT Suspect mental status will remain barrier to weaning - lethargy is what prompted admission and it seems as though his mental status has remained marginal since.    NEUROLOGIC Acute encephalopathy  Brain lesion - R temporal lobe - s/p crani/lobectomy.  DDx in this newly dx HIV pt includes CNS lymphoma, toxoplasmosis, cryptococcus, histoplasmosis.   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 Prophylaxis and ART as above.  Will also need weekly azithromycin  ENDOCRINE No active issue  P:   Monitor glucose on chem     FAMILY  - Updates:  Dr Tommy Medal discussed dx and status with parents on 8/10. Dr Lamonte Sakai updated parents at bedside 8/11. MOm awaited 02/22/2015   - Inter-disciplinary family meet or Palliative Care meeting due by:  8/17      The patient is critically ill with multiple  organ systems failure and requires high complexity decision making for assessment and support, frequent evaluation and titration of therapies, application of advanced monitoring technologies and extensive interpretation of multiple databases.   Critical Care Time devoted to patient care services described in this note is  30  Minutes. This time reflects time of care of this signee Dr Brand Males. This critical care time does not reflect procedure time, or teaching time or supervisory time of PA/NP/Med student/Med Resident etc but could involve care discussion time    Dr. Brand Males, M.D., [facility].C.P Pulmonary and Critical Care Medicine Staff Physician [place] Pager: [phone], If no answer or between  15:00h - 7:00h: call [phone]  02/22/2015 10:13 AM

## 2015-02-22 NOTE — Progress Notes (Addendum)
[facility]    Subjective: intubated and withdrawing to pain   Antibiotics:  Anti-infectives    Start     Dose/Rate Route Frequency Ordered Stop   02/21/15 1000  dolutegravir (TIVICAY) tablet 50 mg    Comments:  Give per TUBE   50 mg Oral Daily 02/20/15 2222     02/21/15 1000  emtricitabine-tenofovir (TRUVADA) 200-300 MG per tablet 1 tablet     1 tablet Per Tube Daily 02/20/15 2222     02/21/15 1000  sulfamethoxazole-trimethoprim (BACTRIM,SEPTRA) 200-40 MG/5ML suspension 20 mL     20 mL Per Tube Daily 02/20/15 2224     02/20/15 2200  vancomycin (VANCOCIN) 1,250 mg in sodium chloride 0.9 % 250 mL IVPB  Status:  Discontinued     1,250 mg 166.7 mL/hr over 90 Minutes Intravenous Every 8 hours 02/20/15 1643 02/20/15 1801   02/20/15 1349  vancomycin (VANCOCIN) 1 GM/200ML IVPB  Status:  Discontinued    Comments:  [name]   : cabinet override      02/20/15 1349 02/20/15 1607   02/20/15 1346  vancomycin (VANCOCIN) 1 GM/200ML IVPB  Status:  Discontinued    Comments:  [name]   : cabinet override      02/20/15 1346 02/20/15 1351   02/20/15 1323  bacitracin 50,000 Units in sodium chloride irrigation 0.9 % 500 mL irrigation  Status:  Discontinued       As needed 02/20/15 1323 02/20/15 1518   02/19/15 2000  acyclovir (ZOVIRAX) 715 mg in dextrose 5 % 150 mL IVPB  Status:  Discontinued     10 mg/kg  71.7 kg 164.3 mL/hr over 60 Minutes Intravenous 3 times per day 02/19/15 1419 02/20/15 1801   02/18/15 2200  vancomycin (VANCOCIN) IVPB 1000 mg/200 mL premix  Status:  Discontinued     1,000 mg 200 mL/hr over 60 Minutes Intravenous Every 8 hours 02/18/15 1146 02/20/15 1643   02/18/15 2200  cefTRIAXone (ROCEPHIN) 2 g in dextrose 5 % 50 mL IVPB  Status:  Discontinued     2 g 100 mL/hr over 30 Minutes Intravenous Every 12 hours 02/18/15 1618 02/20/15 1535   02/18/15 1115  vancomycin (VANCOCIN) 1,750 mg in sodium chloride 0.9 % 500 mL IVPB     1,750  mg 250 mL/hr over 120 Minutes Intravenous STAT 02/18/15 1101 02/18/15 1620   02/18/15 1100  acyclovir (ZOVIRAX) 860 mg in dextrose 5 % 150 mL IVPB  Status:  Discontinued     10 mg/kg  86.2 kg 167.2 mL/hr over 60 Minutes Intravenous 3 times per day 02/18/15 1030 02/19/15 1419   02/18/15 1045  vancomycin (VANCOCIN) IVPB 1000 mg/200 mL premix  Status:  Discontinued     1,000 mg 200 mL/hr over 60 Minutes Intravenous  Once 02/18/15 1030 02/18/15 1101   02/18/15 1045  cefTRIAXone (ROCEPHIN) 2 g in dextrose 5 % 50 mL IVPB  Status:  Discontinued     2 g 100 mL/hr over 30 Minutes Intravenous Every 12 hours 02/18/15 1030 02/18/15 1618      Medications: Scheduled Meds: . antiseptic oral rinse  7 mL Mouth Rinse QID  . chlorhexidine gluconate  15 mL Mouth Rinse BID  . dexamethasone  4 mg Intravenous 3 times per day  . dolutegravir  50 mg Oral Daily  . emtricitabine-tenofovir  1 tablet Per Tube Daily  . feeding supplement (PRO-STAT SUGAR FREE 64)  30 mL Per Tube TID  . levETIRAcetam  500 mg Intravenous Q12H  . multivitamin with minerals  1 tablet Per Tube Daily  . pantoprazole (PROTONIX) IV  40 mg Intravenous QHS  . sulfamethoxazole-trimethoprim  20 mL Per Tube Daily   Continuous Infusions: . dextrose 5 % and 0.45 % NaCl with KCl 10 mEq/L 75 mL/hr at 02/22/15 1438  . feeding supplement (OSMOLITE 1.2 CAL) 1,000 mL (02/22/15 0700)   PRN Meds:.fentaNYL (SUBLIMAZE) injection, fentaNYL (SUBLIMAZE) injection, labetalol, midazolam, naLOXone (NARCAN)  injection, [DISCONTINUED] ondansetron **OR** ondansetron (ZOFRAN) IV, promethazine    Objective: Weight change:   Intake/Output Summary (Last 24 hours) at 02/22/15 1939 Last data filed at 02/22/15 1900  Gross per 24 hour  Intake 2830.42 ml  Output   2820 ml  Net  10.42 ml   Blood pressure 123/75, pulse 60, temperature 99.2 F (37.3 C), temperature source Oral, resp. rate 20, height 6\' 3"  (1.905 m), weight 163 lb 9.3 oz (74.2 kg), SpO2 100  %. Temp:  [97.5 F (36.4 C)-99.4 F (37.4 C)] 99.2 F (37.3 C) (08/12 1555) Pulse Rate:  [52-95] 60 (08/12 1900) Resp:  [12-26] 20 (08/12 1900) BP: (98-143)/(60-85) 123/75 mmHg (08/12 1900) SpO2:  [98 %-100 %] 100 % (08/12 1900) Arterial Line BP: (92-169)/(58-81) 117/80 mmHg (08/12 1700) FiO2 (%):  [40 %] 40 % (08/12 1900) Weight:  [163 lb 9.3 oz (74.2 kg)] 163 lb 9.3 oz (74.2 kg) (08/12 0400)  Physical Exam: General: intubated with dressing in place HEENT: anicteric sclera,  [employer] bradycardic, normal r,  no murmur rubs or gallops Chest: clear to auscultation bilaterally, no wheezing, rales or rhonchi Abdomen: soft  nondistended, normal bowel sounds, Extremities: no  clubbing or edema noted bilaterally Skin: no rashes Neuro: withdrawing to pain  CBC: CBC Latest Ref Rng 02/22/2015 02/21/2015 02/20/2015  WBC 4.0 - 10.5 K/uL 8.2 11.9(H) 4.7  Hemoglobin 13.0 - 17.0 g/dL 9.9(L) 11.0(L) 11.6(L)  Hematocrit 39.0 - 52.0 % 29.6(L) 32.8(L) 34.9(L)  Platelets 150 - 400 K/uL 319 316 349       BMET  Recent Labs  02/21/15 0300 02/22/15 0515  NA 134* 134*  K 4.2 4.0  CL 101 101  CO2 24 25  GLUCOSE 151* 153*  BUN 7 8  CREATININE 0.76 0.72  CALCIUM 8.9 9.0     Liver Panel  No results for input(s): PROT, ALBUMIN, AST, ALT, ALKPHOS, BILITOT, BILIDIR, IBILI in the last 72 hours.     Sedimentation Rate No results for input(s): ESRSEDRATE in the last 72 hours. C-Reactive Protein No results for input(s): CRP in the last 72 hours.  Micro Results: Recent Results (from the past 720 hour(s))  Culture, blood (routine x 2)     Status: None   Collection Time: 02/18/15 11:15 AM  Result Value Ref Range Status   Specimen Description BLOOD RIGHT ANTECUBITAL  Final   Special Requests BOTTLES DRAWN AEROBIC AND ANAEROBIC 5ML  Final   Culture  Setup Time   Final    GRAM POSITIVE COCCI IN CLUSTERS IN BOTH AEROBIC AND ANAEROBIC BOTTLES CRITICAL RESULT CALLED TO, READ BACK BY AND VERIFIED  WITH: R. CLARK,RN AT [id number] ON [id number] BY Rhea Bleacher    Culture   Final    STAPHYLOCOCCUS SPECIES (COAGULASE NEGATIVE) THE SIGNIFICANCE OF ISOLATING THIS ORGANISM FROM A SINGLE SET OF BLOOD CULTURES WHEN MULTIPLE SETS ARE DRAWN IS UNCERTAIN. PLEASE NOTIFY THE MICROBIOLOGY DEPARTMENT WITHIN ONE WEEK IF SPECIATION AND SENSITIVITIES ARE REQUIRED. Performed at [facility]    Report Status 02/22/2015 FINAL  Final  Culture, blood (routine x 2)     Status: None (Preliminary result)   Collection Time: 02/18/15 11:23 AM  Result Value Ref Range Status   Specimen Description BLOOD LEFT ANTECUBITAL  Final   Special Requests BOTTLES DRAWN AEROBIC AND ANAEROBIC 5ML  Final   Culture   Final    NO GROWTH 4 DAYS Performed at [facility]    Report Status PENDING  Incomplete  MRSA PCR Screening     Status: None   Collection Time: 02/19/15 12:02 AM  Result Value Ref Range Status   MRSA by PCR NEGATIVE NEGATIVE Final    Comment:        The GeneXpert MRSA Assay (FDA approved for NASAL specimens only), is one component of a comprehensive MRSA colonization surveillance program. It is not intended to diagnose MRSA infection nor to guide or monitor treatment for MRSA infections.     Studies/Results: Ct Head W Wo Contrast  02/21/2015   CLINICAL DATA:  RIGHT temporal lobectomy for brain tumor performed yesterday. History of AIDS.  EXAM: CT HEAD WITHOUT AND WITH CONTRAST  TECHNIQUE: Contiguous axial images were obtained from the base of the skull through the vertex without and with intravenous contrast  CONTRAST:  74mL OMNIPAQUE IOHEXOL 300 MG/ML  SOLN  COMPARISON:  MRI of the brain February 18, 2015  FINDINGS: Interval RIGHT frontotemporal craniotomy for resection of temporal lobe mass. Vasogenic and possible component of cytotoxic edema extending to the subinsular white matter with small peripheral temporal lobe resection cavity with air. Residual fell by 16 mm enhancing mass along the superior  margin of the resection cavity. In addition, residual 15 x 17 mm enhancing mass posterior to the resection cavity. Abnormal enhancement RIGHT sylvian fissure correspond to prior MR abnormality most consistent with vascular involvement. 7 mm RIGHT to LEFT midline shift is similar with partially effaced RIGHT lateral ventricle, no entrapment or hydrocephalus. Small amount of RIGHT frontotemporal extra-axial pneumocephalus.  Density basal ganglia as seen on prior examination, with nodular abnormal enhancement within the RIGHT basal ganglia. Mildly effaced RIGHT basal cistern. No acute large vascular territory infarct.  Ocular globes and orbital contents are unremarkable. Moderate low paranasal sinusitis with air-fluid level. Moderate LEFT mastoid effusion. Life-support lines in place.  IMPRESSION: Interval RIGHT frontal temporal craniotomy for debulking of RIGHT temporal lobe mass with residual 12 x 16 mm component along the superior margin of the resection cavity. In addition, residual 15 x 17 mm RIGHT temporal lobe nodule. Extensive surrounding edema, with similar 7 mm RIGHT to LEFT mass effect.  Abnormally dense basal ganglia with superimposed nodular enhancement RIGHT basal ganglia, findings compatible with primary CNS lymphoma.   Electronically Signed   By: Elon Alas M.D.   On: 02/21/2015 04:44   Dg Chest Port 1 View  02/22/2015   CLINICAL DATA:  Acute respiratory failure  EXAM: PORTABLE CHEST - 1 VIEW  COMPARISON:  Portable chest x-ray of February 20, 2015  FINDINGS: The lungs are well-expanded. Decreased conspicuity of airspace opacity in the left lower lobe is noted today. There is no pleural effusion or pneumothorax. The heart and pulmonary vascularity are normal. The endotracheal tube tip lies 4.5 cm above the carina. The esophagogastric tube tip projects below the inferior margin of the image.  IMPRESSION: Slight interval improvement in left basilar atelectasis or pneumonia. The support tubes are in  reasonable position.   Electronically Signed   By: David  Martinique M.D.   On: 02/22/2015 07:46   Dg Abd  Portable 1v  02/21/2015   CLINICAL DATA:  Assess orogastric tube positioning  EXAM: PORTABLE ABDOMEN - 1 VIEW  COMPARISON:  None in PACs  FINDINGS: The orogastric tube tip projects in the region of the pylorus with the proximal port in the gastric body. The bowel gas pattern is unremarkable. The lung bases are clear.  IMPRESSION: Interval placement of the orogastric tube with positioning of the tip and proximal port within the stomach.   Electronically Signed   By: David  Martinique M.D.   On: 02/21/2015 14:45      Assessment/Plan:  Active Problems:   Acute encephalopathy   Brain mass   Vasogenic brain edema   Severe protein-calorie malnutrition   Fever   Altered mental status   AIDS   Brain tumor   Respiratory failure   Acute respiratory failure    Loman Logan is a 29 y.o. 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   Primary CNS lymphoma historically with most patients  dying within one year even with chemotherapy however wehave had a few success stories. Dr. Johnnye Sima has a patient who is > 10 years out form treatment.  I have updated the Mother.    LOS: 4 days   Alcide Evener 02/22/2015, 7:39 PM

## 2015-02-22 NOTE — Progress Notes (Signed)
Pt back on FS at this time. RN aware

## 2015-02-23 ENCOUNTER — Inpatient Hospital Stay (HOSPITAL_COMMUNITY): Payer: Medicaid Other

## 2015-02-23 ENCOUNTER — Encounter (HOSPITAL_COMMUNITY): Payer: Self-pay | Admitting: Hematology and Oncology

## 2015-02-23 ENCOUNTER — Other Ambulatory Visit: Payer: Self-pay | Admitting: Hematology and Oncology

## 2015-02-23 DIAGNOSIS — C8589 Other specified types of non-Hodgkin lymphoma, extranodal and solid organ sites: Secondary | ICD-10-CM | POA: Insufficient documentation

## 2015-02-23 DIAGNOSIS — C8331 Diffuse large B-cell lymphoma, lymph nodes of head, face, and neck: Secondary | ICD-10-CM

## 2015-02-23 LAB — BASIC METABOLIC PANEL
Anion gap: 7 (ref 5–15)
BUN: 12 mg/dL (ref 6–20)
CALCIUM: 8.9 mg/dL (ref 8.9–10.3)
CHLORIDE: 101 mmol/L (ref 101–111)
CO2: 26 mmol/L (ref 22–32)
CREATININE: 0.69 mg/dL (ref 0.61–1.24)
GFR calc non Af Amer: >60 mL/min (ref >60)
Glucose, Bld: 139 mg/dL — ABNORMAL HIGH (ref 65–99)
Potassium: 4.1 mmol/L (ref 3.5–5.1)
SODIUM: 134 mmol/L — AB (ref 135–145)

## 2015-02-23 LAB — CBC WITH DIFFERENTIAL/PLATELET
Basophils Absolute: 0 10*3/uL (ref 0.0–0.1)
Basophils Relative: 0 % (ref 0–1)
Eosinophils Absolute: 0 10*3/uL (ref 0.0–0.7)
Eosinophils Relative: 0 % (ref 0–5)
HEMATOCRIT: 30.2 % — AB (ref 39.0–52.0)
HEMOGLOBIN: 9.9 g/dL — AB (ref 13.0–17.0)
LYMPHS ABS: 0.4 10*3/uL — AB (ref 0.7–4.0)
Lymphocytes Relative: 6 % — ABNORMAL LOW (ref 12–46)
MCH: 27.4 pg (ref 26.0–34.0)
MCHC: 32.8 g/dL (ref 30.0–36.0)
MCV: 83.7 fL (ref 78.0–100.0)
Monocytes Absolute: 0.5 10*3/uL (ref 0.1–1.0)
Monocytes Relative: 8 % (ref 3–12)
NEUTROS ABS: 5.8 10*3/uL (ref 1.7–7.7)
NEUTROS PCT: 86 % — AB (ref 43–77)
PLATELETS: 321 10*3/uL (ref 150–400)
RBC: 3.61 MIL/uL — ABNORMAL LOW (ref 4.22–5.81)
RDW: 13.8 % (ref 11.5–15.5)
WBC: 6.8 10*3/uL (ref 4.0–10.5)

## 2015-02-23 LAB — SODIUM
SODIUM: 135 mmol/L (ref 135–145)
Sodium: 134 mmol/L — ABNORMAL LOW (ref 135–145)

## 2015-02-23 LAB — GLUCOSE, CAPILLARY
GLUCOSE-CAPILLARY: 125 mg/dL — AB (ref 65–99)
Glucose-Capillary: 99 mg/dL (ref 65–99)
Glucose-Capillary: 152 mg/dL — ABNORMAL HIGH (ref 65–99)
Glucose-Capillary: 116 mg/dL — ABNORMAL HIGH (ref 65–99)
Glucose-Capillary: 116 mg/dL — ABNORMAL HIGH (ref 65–99)

## 2015-02-23 LAB — CULTURE, BLOOD (ROUTINE X 2): CULTURE: NO GROWTH

## 2015-02-23 LAB — MAGNESIUM: Magnesium: 2.1 mg/dL (ref 1.7–2.4)

## 2015-02-23 LAB — PHOSPHORUS: Phosphorus: 3 mg/dL (ref 2.5–4.6)

## 2015-02-23 MED ORDER — DEXAMETHASONE SODIUM PHOSPHATE 4 MG/ML IJ SOLN
6.0000 mg | Freq: Four times a day (QID) | INTRAMUSCULAR | Status: DC
  Administered 2015-02-23 – 2015-02-27 (×19): 6 mg via INTRAVENOUS
  Filled 2015-02-23: qty 2
  Filled 2015-02-23: qty 1.5
  Filled 2015-02-23: qty 2
  Filled 2015-02-23 (×4): qty 1.5
  Filled 2015-02-23 (×2): qty 2
  Filled 2015-02-23 (×3): qty 1.5
  Filled 2015-02-23: qty 2
  Filled 2015-02-23: qty 1.5
  Filled 2015-02-23: qty 2
  Filled 2015-02-23 (×7): qty 1.5

## 2015-02-23 MED ORDER — ANTISEPTIC ORAL RINSE SOLUTION (CORINZ)
7.0000 mL | Freq: Four times a day (QID) | OROMUCOSAL | Status: DC
  Administered 2015-02-24 – 2015-02-26 (×12): 7 mL via OROMUCOSAL

## 2015-02-23 MED ORDER — CHLORHEXIDINE GLUCONATE 0.12% ORAL RINSE (MEDLINE KIT)
15.0000 mL | Freq: Two times a day (BID) | OROMUCOSAL | Status: DC
  Administered 2015-02-23 – 2015-02-24 (×2): 15 mL via OROMUCOSAL

## 2015-02-23 MED ORDER — SODIUM CHLORIDE 3 % IV SOLN
INTRAVENOUS | Status: DC
  Administered 2015-02-23 (×2): 75 mL/h via INTRAVENOUS
  Administered 2015-02-24 – 2015-02-25 (×5): 100 mL/h via INTRAVENOUS
  Filled 2015-02-23 (×13): qty 500

## 2015-02-23 MED ORDER — FENTANYL CITRATE (PF) 100 MCG/2ML IJ SOLN: 12.5000 ug | INTRAMUSCULAR | Status: DC | PRN

## 2015-02-23 NOTE — Consult Note (Signed)
[facility] CONSULT NOTE  Patient Care Team: [name] as PCP - General (General Practice)  CHIEF COMPLAINTS/PURPOSE OF CONSULTATION:  CNS lymphoma  HISTORY OF PRESENTING ILLNESS:  Victor [name] 29 y.o. male was admitted to the hospital since 8 [id number] for altered mental status and severe headache A month prior to admission, he presented to the emergency department for left thigh pain and was seen by ophthalmologist who diagnosed herpes zoster affecting the left eye. The patient is currently intubated and ventilated. No further history is able to be obtained from the patient. I have for 1 hour long discussion with the mother. Apparently, they have lost contact for almost 2 years. They had rekindled their estraged relationship in June when the patient started to feel unwell. Around June or so, he started to have some abdominal pain and diarrhea and nonspecific illness For the few days leading to the date of admission, the patient had been sleeping a lot at home and was found to have decreased consciousness and was subsequently brought to the ER for further evaluation I review his records extensively. Summary of his oncologic history is as follows:   Primary central nervous system (CNS) lymphoma   02/18/2015 Miscellaneous He tested HIV positive   02/18/2015 Imaging CT scan showed right temporal mass   02/18/2015 Imaging MRI brain showed brain abnormalities   02/20/2015 Pathology Results Brain biopsy confirmed [id number]   02/20/2015 Surgery He underwent cranial resection and brain biopsy   MEDICAL HISTORY:  Past Medical History  Diagnosis Date  . Dental abscess 02/15/2012  . AIDS 02/20/2015    SURGICAL HISTORY: Past Surgical History  Procedure Laterality Date  . Craniotomy Right 02/20/2015    Procedure: Right Temporal Craniotomy for Excision of Mass;  Surgeon: Karie Chimera, MD;  Location: [facility];  Service: Neurosurgery;  Laterality: Right;  Right  Temporal Craniotomy for Excision of Mass    SOCIAL HISTORY: Social History   Social History  . Marital Status: Single    Spouse Name: N/A  . Number of Children: N/A  . Years of Education: N/A   Occupational History  . Not on file.   Social History Main Topics  . Smoking status: Former Smoker    Types: Cigarettes  . Smokeless tobacco: Never Used  . Alcohol Use: Yes  . Drug Use: No  . Sexual Activity: Yes    Birth Control/ Protection: Condom   Other Topics Concern  . Not on file   Social History Narrative    FAMILY HISTORY: History reviewed. No pertinent family history.  ALLERGIES:  is allergic to shrimp and fish allergy.  MEDICATIONS:  Current Facility-Administered Medications  Medication Dose Route Frequency Provider Last Rate Last Dose  . antiseptic oral rinse solution (CORINZ)  7 mL Mouth Rinse QID Velvet Bathe, MD   7 mL at 02/23/15 1217  . chlorhexidine gluconate (PERIDEX) 0.12 % solution 15 mL  15 mL Mouth Rinse BID Velvet Bathe, MD   15 mL at 02/23/15 0729  . dexamethasone (DECADRON) injection 6 mg  6 mg Intravenous 4 times per day Consuella Lose, MD   6 mg at 02/23/15 1213  . dextrose 5 % and 0.45 % NaCl with KCl 10 mEq/L infusion   Intravenous Continuous Karie Chimera, MD   Stopped at 02/23/15 1300  . dolutegravir (TIVICAY) tablet 50 mg  50 mg Oral Daily Truman Hayward, MD   50 mg at 02/23/15 2956  . emtricitabine-tenofovir (TRUVADA) 200-300 MG  per tablet 1 tablet  1 tablet Per Tube Daily Truman Hayward, MD   1 tablet at 02/23/15 [phone]  . feeding supplement (OSMOLITE 1.2 CAL) liquid 1,000 mL  1,000 mL Per Tube Continuous Asencion Islam, RD 65 mL/hr at 02/23/15 1215 1,000 mL at 02/23/15 1215  . feeding supplement (PRO-STAT SUGAR FREE 64) liquid 30 mL  30 mL Per Tube TID Asencion Islam, RD   30 mL at 02/23/15 0917  . fentaNYL (SUBLIMAZE) injection 12.5-50 mcg  12.5-50 mcg Intravenous Q2H PRN Brand Males, MD      . labetalol (NORMODYNE,TRANDATE)  injection 10-40 mg  10-40 mg Intravenous Q10 min PRN Karie Chimera, MD      . levETIRAcetam (KEPPRA) 500 mg in sodium chloride 0.9 % 100 mL IVPB  500 mg Intravenous Q12H Karie Chimera, MD   500 mg at 02/23/15 0418  . midazolam (VERSED) injection 2 mg  2 mg Intravenous Q15 min PRN Collene Gobble, MD      . multivitamin with minerals tablet 1 tablet  1 tablet Per Tube Daily Asencion Islam, RD   1 tablet at 02/23/15 0917  . naloxone [facility]) injection 0.08 mg  0.08 mg Intravenous PRN Karie Chimera, MD      . ondansetron [facility]) injection 4 mg  4 mg Intravenous Q4H PRN Karie Chimera, MD      . pantoprazole (PROTONIX) injection 40 mg  40 mg Intravenous QHS Karie Chimera, MD   40 mg at 02/22/15 2202  . promethazine (PHENERGAN) tablet 12.5-25 mg  12.5-25 mg Oral Q4H PRN Karie Chimera, MD      . sodium chloride (hypertonic) 3 % solution   Intravenous Continuous Consuella Lose, MD 75 mL/hr at 02/23/15 1428 75 mL/hr at 02/23/15 1428  . sulfamethoxazole-trimethoprim (BACTRIM,SEPTRA) 200-40 MG/5ML suspension 20 mL  20 mL Per Tube Daily Truman Hayward, MD   20 mL at 02/23/15 1000    REVIEW OF SYSTEMS:  Unable to obtain due to his current status  PHYSICAL EXAMINATION: ECOG PERFORMANCE STATUS: 3 - Symptomatic, >50% confined to bed  Filed Vitals:   02/23/15 1400  BP: 122/73  Pulse: 61  Temp:   Resp: 13   Filed Weights   02/18/15 2340 02/22/15 0400 02/23/15 0600  Weight: 158 lb 1.1 oz (71.7 kg) 163 lb 9.3 oz (74.2 kg) 162 lb 11.2 oz (73.8 kg)    GENERAL:alert, intubated and ventilated SKIN: skin color, texture, turgor are normal, no rashes or significant lesions EYES: Noted mild swelling around the right periorbital region. His eyes are open without any signs of conjunctivitis OROPHARYNX: Unable to examine, ET tube in situ NECK: supple, thyroid normal size, non-tender, without nodularity LYMPH:  no palpable lymphadenopathy in the cervical, axillary or inguinal LUNGS: clear to auscultation  and percussion with normal breathing effort HEART: regular rate & rhythm and no murmurs and no lower extremity edema ABDOMEN:abdomen soft, non-tender and normal bowel sounds Musculoskeletal:no cyanosis of digits and no clubbing  PSYCH: alert but not able to follow commands NEURO: Unable to assess  LABORATORY DATA:  I have reviewed the data as listed Lab Results  Component Value Date   WBC 6.8 02/23/2015   HGB 9.9* 02/23/2015   HCT 30.2* 02/23/2015   MCV 83.7 02/23/2015   PLT 321 02/23/2015    Recent Labs  02/21/15 0300 02/22/15 0515 02/23/15 0230 02/23/15 1155  NA 134* 134* 134* 134*  K 4.2 4.0 4.1  --   CL 101 101 101  --  CO2 24 25 26   --   GLUCOSE 151* 153* 139*  --   BUN 7 8 12   --   CREATININE 0.76 0.72 0.69  --   CALCIUM 8.9 9.0 8.9  --   GFRNONAA >60 >60 >60  --   GFRAA >60 >60 >60  --     RADIOGRAPHIC STUDIES: I reviewed most of his CT scan and MRI I have personally reviewed the radiological images as listed and agreed with the findings in the report. Ct Head Wo Contrast  02/23/2015   CLINICAL DATA:  Coma, AIDS with primary lymphoma of brain.  EXAM: CT HEAD WITHOUT CONTRAST  TECHNIQUE: Contiguous axial images were obtained from the base of the skull through the vertex without intravenous contrast.  COMPARISON:  February 21, 2015.  FINDINGS: Status post right frontal and temporal craniotomy. Two masses are again noted laterally in the right temporal lobe with surrounding white matter edema. There appears to be a small amount of hemorrhage around these lesions which may be slightly increased compared to prior exam and consistent with postoperative hemorrhage. 1 cm of right to left midline shift is noted which is increased compared to prior exam. 2.7 x 2.5 cm mass is noted more medially in right cerebral hemisphere with surrounding white matter edema. Ventricular size is within normal limits.  IMPRESSION: Increased right to left midline shift is noted most likely due to  worsening white matter edema due to intraparenchymal masses as described above. There appears to be a slightly increased amount of postoperative hemorrhage seen inferiorly in the right temporal lobe. Critical Value/emergent results were called by telephone at the time of interpretation on 02/23/2015 at 10:13 am to Leverne Humbles, the patient's nurse, who verbally acknowledged these results and will contact the physician.   Electronically Signed   By: Marijo Conception, M.D.   On: 02/23/2015 10:13   Ct Head Wo Contrast  02/18/2015   CLINICAL DATA:  Neck pain, shingles  EXAM: CT HEAD WITHOUT CONTRAST  TECHNIQUE: Contiguous axial images were obtained from the base of the skull through the vertex without intravenous contrast.  COMPARISON:  None.  FINDINGS: No skull fracture is noted. There is no intracranial hemorrhage. There is heterogeneous mass in right basal ganglia just lateral to caudate no gross measures at least 3.5 cm. There is vasogenic edema in right temporal lobe and right frontal lobe. There is about 5 mm right to left midline shift at the level of the lesion. There is mass effect on right frontal horn. No intraventricular hemorrhage.  IMPRESSION: Nodular heterogeneous mass in right basal ganglia/ lateral to caudate nucleus measures at least 3.5 cm. Vasogenic edema in right frontal and right temporal lobe. Mass effect on right lateral ventricle. No intraventricular hemorrhage.  Further evaluation with unenhanced and enhanced MRI is recommended. These results were called by telephone at the time of interpretation on 02/18/2015 at 10:57 am to Dr. Montine Circle , who verbally acknowledged these results.   Electronically Signed   By: Lahoma Crocker M.D.   On: 02/18/2015 10:58   Ct Head W Wo Contrast  02/21/2015   CLINICAL DATA:  RIGHT temporal lobectomy for brain tumor performed yesterday. History of AIDS.  EXAM: CT HEAD WITHOUT AND WITH CONTRAST  TECHNIQUE: Contiguous axial images were obtained from the base of  the skull through the vertex without and with intravenous contrast  CONTRAST:  82mL OMNIPAQUE IOHEXOL 300 MG/ML  SOLN  COMPARISON:  MRI of the brain February 18, 2015  FINDINGS: Interval RIGHT frontotemporal craniotomy for resection of temporal lobe mass. Vasogenic and possible component of cytotoxic edema extending to the subinsular white matter with small peripheral temporal lobe resection cavity with air. Residual fell by 16 mm enhancing mass along the superior margin of the resection cavity. In addition, residual 15 x 17 mm enhancing mass posterior to the resection cavity. Abnormal enhancement RIGHT sylvian fissure correspond to prior MR abnormality most consistent with vascular involvement. 7 mm RIGHT to LEFT midline shift is similar with partially effaced RIGHT lateral ventricle, no entrapment or hydrocephalus. Small amount of RIGHT frontotemporal extra-axial pneumocephalus.  Density basal ganglia as seen on prior examination, with nodular abnormal enhancement within the RIGHT basal ganglia. Mildly effaced RIGHT basal cistern. No acute large vascular territory infarct.  Ocular globes and orbital contents are unremarkable. Moderate low paranasal sinusitis with air-fluid level. Moderate LEFT mastoid effusion. Life-support lines in place.  IMPRESSION: Interval RIGHT frontal temporal craniotomy for debulking of RIGHT temporal lobe mass with residual 12 x 16 mm component along the superior margin of the resection cavity. In addition, residual 15 x 17 mm RIGHT temporal lobe nodule. Extensive surrounding edema, with similar 7 mm RIGHT to LEFT mass effect.  Abnormally dense basal ganglia with superimposed nodular enhancement RIGHT basal ganglia, findings compatible with primary CNS lymphoma.   Electronically Signed   By: Elon Alas M.D.   On: 02/21/2015 04:44   Ct Cervical Spine Wo Contrast  02/18/2015   CLINICAL DATA:  Injury post fall  EXAM: CT CERVICAL SPINE WITHOUT CONTRAST  TECHNIQUE: Multidetector CT  imaging of the cervical spine was performed without intravenous contrast. Multiplanar CT image reconstructions were also generated.  COMPARISON:  None.  FINDINGS: Axial images of the cervical spine shows no acute fracture or subluxation. Computer processed images shows no acute fracture or subluxation. There is partial opacification of left mastoid air cells. No pneumothorax in visualized lung apices. Alignment, disc spaces and vertebral body heights are preserved.  IMPRESSION: No cervical spine acute fracture or subluxation. There is no pneumothorax in visualized lung apices. Partial opacification of left mastoid air cells.   Electronically Signed   By: Lahoma Crocker M.D.   On: 02/18/2015 12:38   Mr Jeri Cos ZH Contrast  02/18/2015   ADDENDUM REPORT: 02/18/2015 19:03  ADDENDUM: Study discussed by telephone with Dr. Olevia Bowens on 02/18/2015 at [place] hrs. In addition to the above clinical data, Dr. Olevia Bowens reports the patient has had 3 months of unexplained weight loss. The patient had been on antiviral medications for orbital shingles for 2 weeks. He also advised that he is screening the patient for HIV, which I think is prudent.   Electronically Signed   By: Genevie Ann M.D.   On: 02/18/2015 19:03   02/18/2015   CLINICAL DATA:  29 year old male is decreasing responsiveness and headaches for 3 days. 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 Right basal ganglia masslike abnormality on head CT earlier today. No fever in the emergency department. Initial encounter.  EXAM: MRI HEAD WITHOUT AND WITH CONTRAST  TECHNIQUE: Multiplanar, multiecho pulse sequences of the brain and surrounding structures were obtained without and with intravenous contrast.  CONTRAST:  87mL MULTIHANCE GADOBENATE DIMEGLUMINE 529 MG/ML IV SOLN  COMPARISON:  Head CT without contrast 1046 hr today.  FINDINGS: Both caudate nuclei are abnormally enlarged and T2 heterogeneous, with mass like somewhat  intermediate T2 signal, but the right side is much worse with diffuse basal ganglia involvement.  There are some areas of restricted diffusion, but there is a petechial pattern of abnormal right basal ganglia enhancement, also with demonstration of mildly enlarged perforating vessels serving this region (series 7, image 12). There is also petechial abnormal hyper enhancement in the anterior right middle gyrus in a nodular cluster (series 14, image 37, with mild T2 and FLAIR hyperintensity here which is fairly contiguous with that about the right basal ganglia.  Furthermore, there is abnormal signal in both medial thalami (same image), and abnormal T2 and FLAIR hyperintensity tracking from the midbrain into the periaqueductal gray matter and also with subtle nodularity and hyperintensity along the fourth ventricle (series 8, image 6). No associated enhancement or significant mass effect at these sites.  There is also diffuse T2 and FLAIR hyperintensity tracking from the right basal ganglia into the right temporal lobe, in a vasogenic edema type pattern. There are multiple dark T2 masslike areas in the right temporal lobe which are both peripheral (extending to the cortical surface) and at the gray-white junctions. As these rounded temporal lobe lesions range from 5 to 32 mm diameter and demonstrate mostly homogeneous enhancement and restricted diffusion.  There is intracranial mass effect with left to right midline shift up to 8 mm. There is effacement of the right lateral ventricle and cavum septum callosum. No ventriculomegaly. No debris within the ventricles. No dural thickening or meningeal enhancement. Basilar cisterns remain patent. No restricted diffusion in a pattern suggestive of acute infarction. Negative pituitary, cervicomedullary junction, and visualized cervical spine. No definite acute intracranial hemorrhage. Major intracranial vascular flow voids are preserved.  There are inflammatory changes in the  paranasal sinuses and left mastoids. Intraorbital soft tissues appear within normal limits. Scalp soft tissues appear within normal limits. Overall bone marrow signal is within normal limits.  IMPRESSION: Markedly abnormal brain with multifocal mostly intermediate to low T2 signal masslike lesions with enhancement and edema. Right greater than left hemisphere involvement, also with nonenhancing signal abnormality tracking along the periaqueductal gray matter and fourth ventricle.  Top differential considerations are primary CNS lymphoma (favored) and multicentric glioblastoma. Infectious etiology (severe encephalitis) is considered but felt much less likely.  Intracranial mass effect with leftward midline shift of 8 mm and ventricular effacement. Basilar cisterns remain patent. No ventriculomegaly.  Electronically Signed: By: Genevie Ann M.D. On: 02/18/2015 18:38   Dg Chest Port 1 View  02/23/2015   CLINICAL DATA:  Endotracheal tube adjustment, central line placement.  EXAM: PORTABLE CHEST - 1 VIEW  COMPARISON:  February 23, 2015.  FINDINGS: Endotracheal tube has been repositioned with distal tip approximately 5 cm above the carina. Nasogastric tube is seen entering the stomach. Interval placement of left internal jugular catheter with distal tip in approximate position of cavoatrial junction. No pneumothorax or pleural effusion is noted. No acute pulmonary disease is noted. Bony thorax is intact.  IMPRESSION: Endotracheal and nasogastric tubes in grossly good position. Interval placement of left internal jugular catheter with its distal tip in expected position of cavoatrial junction.   Electronically Signed   By: Marijo Conception, M.D.   On: 02/23/2015 13:53   Dg Chest Port 1 View  02/23/2015   CLINICAL DATA:  Intubated  EXAM: PORTABLE CHEST - 1 VIEW  COMPARISON:  02/22/2015  FINDINGS: Cardiomediastinal silhouette is stable. Endotracheal tube with tip in upper trachea about 6.6 cm above the carina. NG tube in  place. No acute infiltrate or pulmonary edema. There is no pneumothorax. Question tiny pneumomediastinum left pericardiac.  IMPRESSION: Endotracheal tube with tip in upper trachea about 6.6 cm above the carina. NG tube in place. No acute infiltrate or pulmonary edema. There is no pneumothorax. Question tiny pneumomediastinum left pericardiac.   Electronically Signed   By: Lahoma Crocker M.D.   On: 02/23/2015 11:08   Dg Chest Port 1 View  02/22/2015   CLINICAL DATA:  Acute respiratory failure  EXAM: PORTABLE CHEST - 1 VIEW  COMPARISON:  Portable chest x-ray of February 20, 2015  FINDINGS: The lungs are well-expanded. Decreased conspicuity of airspace opacity in the left lower lobe is noted today. There is no pleural effusion or pneumothorax. The heart and pulmonary vascularity are normal. The endotracheal tube tip lies 4.5 cm above the carina. The esophagogastric tube tip projects below the inferior margin of the image.  IMPRESSION: Slight interval improvement in left basilar atelectasis or pneumonia. The support tubes are in reasonable position.   Electronically Signed   By: David  Martinique M.D.   On: 02/22/2015 07:46   Dg Chest Portable 1 View  02/20/2015   CLINICAL DATA:  Respiratory failure, intubated patient, encephalopathy, brain mass, fever, previous smoker  EXAM: PORTABLE CHEST - 1 VIEW  COMPARISON:  None  FINDINGS: The lungs are well-expanded. There is increased density in the retrocardiac region on the left. The endotracheal tube tip lies 4.8 cm above the carina. The heart and pulmonary vascularity are normal. The bony thorax is unremarkable.  IMPRESSION: Left lower lobe atelectasis or early pneumonia. The endotracheal tube is in reasonable position.   Electronically Signed   By: David  Martinique M.D.   On: 02/20/2015 16:39   Dg Abd Portable 1v  02/21/2015   CLINICAL DATA:  Assess orogastric tube positioning  EXAM: PORTABLE ABDOMEN - 1 VIEW  COMPARISON:  None in PACs  FINDINGS: The orogastric tube tip  projects in the region of the pylorus with the proximal port in the gastric body. The bowel gas pattern is unremarkable. The lung bases are clear.  IMPRESSION: Interval placement of the orogastric tube with positioning of the tip and proximal port within the stomach.   Electronically Signed   By: David  Martinique M.D.   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 Treatment will be slightly different depending on the type. He would benefit from PET CT scan as an outpatient I have a long discussion with the patient's mother, explaining the natural history and behavior of diffuse large B cell lymphoma/CNS lymphoma. The patient is to be on anti-retroviral treatment along with systemic chemotherapy in the future. I will get his case presented at the next hematology tumor Board on Tuesday next week.  Anemia of chronic illness He is not symptomatic, observe only  HIV infection He is followed closely by infectious disease and had been started on anti-retroviral treatment  Postoperative hemorrhage/edema He is currently on hypertonic saline and high-dose steroid-induced therapy along with Keppra for seizure prophylaxis  The rest of his treatment per primary service I will return early next week for further assessment  All questions were answered.   I spent almost 1.5 hours on this consult today  [place], Bricelyn, MD 02/23/2015 3:02 PM

## 2015-02-23 NOTE — Progress Notes (Signed)
PULMONARY / CRITICAL CARE MEDICINE   Name: Victor [name] MRN: [id number] DOB: 02/18/1986    ADMISSION DATE:  02/18/2015 CONSULTATION DATE:  8/10  REFERRING MD :  Kritzer  CHIEF COMPLAINT:  Vent management   INITIAL PRESENTATION:  29yo male with no PMH except for shingles in L eye several weeks prior to admission (rx with antiviral) presented 8/8 with 2 week hx headache, weight loss and progressive lethargy and confusion.  Found to have several lesions in R temporal lobe with mass effect.  Seen in consultation by neurosurgery and taken 8/10 to OR for craniotomy and R temporal lobectomy.  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 PCCM consulted post op for vent management.   STUDIES:  MR brain 8/8>>> Markedly abnormal brain with multifocal mostly intermediate to low T2 signal masslike lesions with enhancement and edema. Right greater than left hemisphere involvement, also with nonenhancing signal abnormality tracking along the periaqueductal gray matter and fourth ventricle.   Intracranial mass effect with leftward midline shift of 8 mm and ventricular effacement. Basilar cisterns remain patent. No ventriculomegaly. Head CT 8/11 >>  Interval RIGHT frontal temporal craniotomy for debulking of RIGHT temporal lobe mass with residual 12 x 16 mm component along the superior margin of the resection cavity. In addition, residual 15 x 17 mm RIGHT temporal lobe nodule. Extensive surrounding edema, with similar 7 mm RIGHT to LEFT mass effect. Abnormally dense basal ganglia with superimposed nodular enhancement RIGHT basal ganglia, findings compatible with primary CNS lymphoma.  SIGNIFICANT EVENTS: 8/10>> OR for crani, R temporal lobectomy 02/21/15: Pathology pending on brain mass, initial stains consistent w neoplasm Appreciate ID consult  02/22/15: Per RN - moves Rt side  well but LUE/LLE - weak purposefully but not awake enough and will not follow commands. Gag + , Cough +. Path still pending. Not on pressors.  No fever. Not on sedation gtt. Not needed prn sedation   SUBJECTIVE/OVERNIGHT/INTERVAL HX 02/23/2015 :Tube feed holiday needed forHIV meds. 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 OFf sedatio - RASS -3 and moves toes to command. On decadron. Similar to 24h ago. GAG +, Cough +  VITAL SIGNS: Temp:  [97.7 F (36.5 C)-99.2 F (37.3 C)] 98.5 F (36.9 C) (08/13 0356) Pulse Rate:  [50-80] 56 (08/13 0700) Resp:  [12-25] 12 (08/13 0700) BP: (98-143)/(67-84) 119/73 mmHg (08/13 0700) SpO2:  [99 %-100 %] 99 % (08/13 0700) Arterial Line BP: (92-131)/(65-81) 117/80 mmHg (08/12 1700) FiO2 (%):  [40 %] 40 % (08/13 0730) Weight:  [73.8 kg (162 lb 11.2 oz)] 73.8 kg (162 lb 11.2 oz) (08/13 0600) HEMODYNAMICS:   VENTILATOR SETTINGS: Vent Mode:  [-] PRVC FiO2 (%):  [40 %] 40 % Set Rate:  [12 bmp] 12 bmp Vt Set:  [650 mL] 650 mL PEEP:  [5 cmH20] 5 cmH20 Pressure Support:  [5 cmH20-8 cmH20] 8 cmH20 Plateau Pressure:  [14 cmH20-19 cmH20] 19 cmH20 INTAKE / OUTPUT:  Intake/Output Summary (Last 24 hours) at 02/23/15 0827 Last data filed at 02/23/15 0700  Gross per 24 hour  Intake 3101.83 ml  Output   2770 ml  Net 331.83 ml    PHYSICAL EXAMINATION: General:  Thin young male, NAD on vent  Neuro:  RASS -3 off sedation, gag +, cough +. Moves rt toes to command HEENT:  Mm moist, ETT Cardiovascular:  s1s2 rrr Lungs:  resps even non labored on vent, coarse  Abdomen:  Soft, +bs  Musculoskeletal:  Warm and dry, no  edema   LABS:  PULMONARY No results for input(s): PHART, PCO2ART, PO2ART, HCO3, TCO2, O2SAT in the last 168 hours.  Invalid input(s): PCO2, PO2  CBC  Recent Labs Lab 02/21/15 0300 02/22/15 0515 02/23/15 0230  HGB 11.0* 9.9* 9.9*  HCT 32.8* 29.6* 30.2*  WBC 11.9* 8.2 6.8  PLT 316 319 321    COAGULATION  Recent Labs Lab 02/18/15 1502  INR 1.24    CARDIAC  No results for input(s): TROPONINI in the last 168 hours. No  results for input(s): PROBNP in the last 168 hours.   CHEMISTRY  Recent Labs Lab 02/19/15 1333 02/20/15 1600 02/21/15 0300 02/22/15 0515 02/23/15 0230  NA 134* 133* 134* 134* 134*  K 4.4 3.7 4.2 4.0 4.1  CL 101 102 101 101 101  CO2 22 22 24 25 26   GLUCOSE 129* 122* 151* 153* 139*  BUN 12 10 7 8 12   CREATININE 0.75 0.74 0.76 0.72 0.69  CALCIUM 8.8* 8.7* 8.9 9.0 8.9  MG  --   --   --  2.1 2.1  PHOS  --   --   --  2.9 3.0   Estimated Creatinine Clearance: 142.2 mL/min (by C-G formula based on Cr of 0.69).   LIVER  Recent Labs Lab 02/18/15 1502  INR 1.24     INFECTIOUS  Recent Labs Lab 02/18/15 1500 02/19/15 1333 02/19/15 1613  LATICACIDVEN 1.2 1.0 1.0     ENDOCRINE CBG (last 3)   Recent Labs  02/22/15 1951 02/22/15 2329 02/23/15 0344  GLUCAP 131* 115* 99         IMAGING x48h  - image(s) personally visualized  -   highlighted in bold Dg Chest Port 1 View  02/22/2015   CLINICAL DATA:  Acute respiratory failure  EXAM: PORTABLE CHEST - 1 VIEW  COMPARISON:  Portable chest x-ray of February 20, 2015  FINDINGS: The lungs are well-expanded. Decreased conspicuity of airspace opacity in the left lower lobe is noted today. There is no pleural effusion or pneumothorax. The heart and pulmonary vascularity are normal. The endotracheal tube tip lies 4.5 cm above the carina. The esophagogastric tube tip projects below the inferior margin of the image.  IMPRESSION: Slight interval improvement in left basilar atelectasis or pneumonia. The support tubes are in reasonable position.   Electronically Signed   By: David  Martinique M.D.   On: 02/22/2015 07:46   Dg Abd Portable 1v  02/21/2015   CLINICAL DATA:  Assess orogastric tube positioning  EXAM: PORTABLE ABDOMEN - 1 VIEW  COMPARISON:  None in PACs  FINDINGS: The orogastric tube tip projects in the region of the pylorus with the proximal port in the gastric body. The bowel gas pattern is unremarkable. The lung bases are clear.   IMPRESSION: Interval placement of the orogastric tube with positioning of the tip and proximal port within the stomach.   Electronically Signed   By: David  Martinique M.D.   On: 02/21/2015 14:45          ASSESSMENT / PLAN:  PULMONARY OETT 8/10>>> Acute respiratory failure - post op neurosurgery    - not awake enough for extubation  P:   Full vent support (was lethargic at admit)   NEUROLOGIC Acute encephalopathy  Brain lesion - R temporal lobe - s/p crani/lobectomy.02/20/15     - confirmed B Cell lymphoma. Patient is RASS -3 comatose post op. 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 Prophylaxis and ART as above. Will also need weekly azithromycin  ENDOCRINE No active issue  P:   Monitor glucose on chem     FAMILY  - Updates:  Dr Tommy Medal discussed dx and status with parents on 8/10. Dr Lamonte Sakai updated parents at bedside 8/11. MOm updated  02/22/2015 at bedside. She is not at bedside 02/23/2015   - Inter-disciplinary family meet or Palliative Care meeting  due by:  8/17      The patient is critically ill with multiple organ systems failure and requires high complexity decision making for assessment and support, frequent evaluation and titration of therapies, application of advanced monitoring technologies and extensive interpretation of multiple databases.   Critical Care Time devoted to patient care services described in this note is  30  Minutes. This time reflects time of care of this signee Dr Brand Males. This critical care time does not reflect procedure time, or teaching time or supervisory time of PA/NP/Med student/Med Resident etc but could involve care discussion time    Dr. Brand Males, M.D., [facility].C.P Pulmonary and Critical Care Medicine Staff Physician [place] [facility] Pager: [phone], If no answer or between  15:00h - 7:00h: call [phone]  02/23/2015 8:27 AM

## 2015-02-23 NOTE — Procedures (Signed)
Central Venous Catheter Insertion Procedure Note Colbert Curenton [facility] [id number] 1986/05/24  Procedure: Insertion of Central Venous Catheter Indications: Assessment of intravascular volume, Drug and/or fluid administration and Frequent blood sampling  Procedure Details Consent: Risks of procedure as well as the alternatives and risks of each were explained to the (patient/caregiver).  Consent for procedure obtained.   Time Out: Verified patient identification, verified procedure, site/side was marked, verified correct patient position, special equipment/implants available, medications/allergies/relevent history reviewed, required imaging and test results available.  Performed  Maximum sterile technique was used including antiseptics, cap, gloves, gown, hand hygiene, mask and sheet. Skin prep: Chlorhexidine; local anesthetic administered A triple lumen catheter was placed in the left internal jugular vein to 20 cm using the Seldinger technique.  Sutured in place.    Evaluation Blood flow good Complications: No apparent complications Patient did tolerate procedure well. Chest X-ray ordered to verify placement.  CXR: pending.   Procedure performed under direct supervision of Dr. Nelda Marseille and with ultrasound guidance for real time vessel cannulation.      Noe Gens, NP-C [place] [facility] Pgr: [phone] or if no answer [phone] 02/23/2015, 1:13 PM  I was present and supervised the entire procedure.  Rush Farmer, M.D. [facility] Pulmonary/Critical Care Medicine. Pager: [phone]. After hours pager: [phone].

## 2015-02-23 NOTE — Progress Notes (Signed)
Changed vent back to full vent support d/t pt w/ agitation, increased RR, increased coughing spells.  Attempted first to increase PS, pt continued to be agitated. RN aware.

## 2015-02-23 NOTE — Progress Notes (Signed)
Pt seen and examined. No issues overnight.   EXAM: Temp:  [97.7 F (36.5 C)-99.2 F (37.3 C)] 98.9 F (37.2 C) (08/13 0800) Pulse Rate:  [50-80] 65 (08/13 0900) Resp:  [12-25] 23 (08/13 0900) BP: (114-143)/(67-84) 118/68 mmHg (08/13 0900) SpO2:  [99 %-100 %] 100 % (08/13 0900) Arterial Line BP: (92-131)/(65-81) 117/80 mmHg (08/12 1700) FiO2 (%):  [40 %] 40 % (08/13 0730) Weight:  [73.8 kg (162 lb 11.2 oz)] 73.8 kg (162 lb 11.2 oz) (08/13 0600) Intake/Output      08/12 0701 - 08/13 0700 08/13 0701 - 08/14 0700   I.V. (mL/kg) 1847.5 (25) 150 (2)   NG/GT 1154.3 130   IV Piggyback 210    Total Intake(mL/kg) 3211.8 (43.5) 280 (3.8)   Urine (mL/kg/hr) 2970 (1.7) 290 (1.7)   Total Output 2970 290   Net +241.8 -10         No eye opening to pain Does grimace to noxious stim Not reliably following commands W/d RUE/RLE No significant movement LUE/LLE  LABS: Lab Results  Component Value Date   CREATININE 0.69 02/23/2015   BUN 12 02/23/2015   NA 134* 02/23/2015   K 4.1 02/23/2015   CL 101 02/23/2015   CO2 26 02/23/2015   Lab Results  Component Value Date   WBC 6.8 02/23/2015   HGB 9.9* 02/23/2015   HCT 30.2* 02/23/2015   MCV 83.7 02/23/2015   PLT 321 02/23/2015    IMPRESSION: - 29 y.o. male POD# 3 s/p right temporal tumor resection. Has been much more obtunded postop - HIV/AIDS with new B-cell PCNSL  PLAN: - Will increase decadron to 6mg  Q6 hrs - Repeat CTH this am. If increasing temporal edema with brainstem compression, may need to start hypertonic saline and/or mannitol

## 2015-02-24 ENCOUNTER — Inpatient Hospital Stay (HOSPITAL_COMMUNITY): Payer: Medicaid Other

## 2015-02-24 ENCOUNTER — Other Ambulatory Visit: Payer: Self-pay | Admitting: Hematology and Oncology

## 2015-02-24 DIAGNOSIS — B20 Human immunodeficiency virus [HIV] disease: Secondary | ICD-10-CM | POA: Insufficient documentation

## 2015-02-24 DIAGNOSIS — C8599 Non-Hodgkin lymphoma, unspecified, extranodal and solid organ sites: Secondary | ICD-10-CM

## 2015-02-24 LAB — GLUCOSE, CAPILLARY
GLUCOSE-CAPILLARY: 117 mg/dL — AB (ref 65–99)
GLUCOSE-CAPILLARY: 117 mg/dL — AB (ref 65–99)
GLUCOSE-CAPILLARY: 120 mg/dL — AB (ref 65–99)
GLUCOSE-CAPILLARY: 133 mg/dL — AB (ref 65–99)
Glucose-Capillary: 115 mg/dL — ABNORMAL HIGH (ref 65–99)

## 2015-02-24 LAB — SODIUM
SODIUM: 135 mmol/L (ref 135–145)
SODIUM: 142 mmol/L (ref 135–145)
Sodium: 138 mmol/L (ref 135–145)

## 2015-02-24 LAB — BASIC METABOLIC PANEL
Anion gap: 6 (ref 5–15)
BUN: 13 mg/dL (ref 6–20)
CALCIUM: 8.8 mg/dL — AB (ref 8.9–10.3)
CO2: 26 mmol/L (ref 22–32)
Chloride: 106 mmol/L (ref 101–111)
Creatinine, Ser: 0.65 mg/dL (ref 0.61–1.24)
GFR calc Af Amer: >60 mL/min (ref >60)
GFR calc non Af Amer: >60 mL/min (ref >60)
GLUCOSE: 116 mg/dL — AB (ref 65–99)
POTASSIUM: 4.4 mmol/L (ref 3.5–5.1)
Sodium: 138 mmol/L (ref 135–145)

## 2015-02-24 LAB — CBC WITH DIFFERENTIAL/PLATELET
BASOS PCT: 0 % (ref 0–1)
Basophils Absolute: 0 10*3/uL (ref 0.0–0.1)
EOS ABS: 0 10*3/uL (ref 0.0–0.7)
Eosinophils Relative: 0 % (ref 0–5)
HEMATOCRIT: 30.5 % — AB (ref 39.0–52.0)
HEMOGLOBIN: 10 g/dL — AB (ref 13.0–17.0)
LYMPHS PCT: 4 % — AB (ref 12–46)
Lymphs Abs: 0.3 10*3/uL — ABNORMAL LOW (ref 0.7–4.0)
MCH: 27.9 pg (ref 26.0–34.0)
MCHC: 32.8 g/dL (ref 30.0–36.0)
MCV: 85.2 fL (ref 78.0–100.0)
MONO ABS: 0.4 10*3/uL (ref 0.1–1.0)
Monocytes Relative: 6 % (ref 3–12)
NEUTROS ABS: 6.5 10*3/uL (ref 1.7–7.7)
Neutrophils Relative %: 90 % — ABNORMAL HIGH (ref 43–77)
Platelets: 326 10*3/uL (ref 150–400)
RBC: 3.58 MIL/uL — AB (ref 4.22–5.81)
RDW: 13.8 % (ref 11.5–15.5)
WBC: 7.2 10*3/uL (ref 4.0–10.5)

## 2015-02-24 LAB — PHOSPHORUS: PHOSPHORUS: 3.3 mg/dL (ref 2.5–4.6)

## 2015-02-24 LAB — MAGNESIUM: Magnesium: 2.1 mg/dL (ref 1.7–2.4)

## 2015-02-24 MED ORDER — CHLORHEXIDINE GLUCONATE 0.12 % MT SOLN
15.0000 mL | Freq: Two times a day (BID) | OROMUCOSAL | Status: DC
  Administered 2015-02-24 – 2015-02-25 (×3): 15 mL via OROMUCOSAL

## 2015-02-24 MED ORDER — WHITE PETROLATUM GEL
Status: AC
  Administered 2015-02-24: 0.2
  Filled 2015-02-24: qty 1

## 2015-02-24 MED ORDER — CETYLPYRIDINIUM CHLORIDE 0.05 % MT LIQD: 7.0000 mL | Freq: Two times a day (BID) | OROMUCOSAL | Status: DC

## 2015-02-24 NOTE — Progress Notes (Signed)
PULMONARY / CRITICAL CARE MEDICINE   Name: Victor [name] MRN: [id number] DOB: 1985/08/13    ADMISSION DATE:  02/18/2015 CONSULTATION DATE:  8/10  REFERRING MD :  Kritzer  CHIEF COMPLAINT:  Vent management   INITIAL PRESENTATION:  29yo male with no PMH except for shingles in L eye several weeks prior to admission (rx with antiviral) presented 8/8 with 2 week hx headache, weight loss and progressive lethargy and confusion.  Found to have several lesions in R temporal lobe with mass effect.  Seen in consultation by neurosurgery and taken 8/10 to OR for craniotomy and R temporal lobectomy.  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 PCCM consulted post op for vent management.   STUDIES:  MR brain 8/8>>> Markedly abnormal brain with multifocal mostly intermediate to low T2 signal masslike lesions with enhancement and edema. Right greater than left hemisphere involvement, also with nonenhancing signal abnormality tracking along the periaqueductal gray matter and fourth ventricle.   Intracranial mass effect with leftward midline shift of 8 mm and ventricular effacement. Basilar cisterns remain patent. No ventriculomegaly. Head CT 8/11 >>  Interval RIGHT frontal temporal craniotomy for debulking of RIGHT temporal lobe mass with residual 12 x 16 mm component along the superior margin of the resection cavity. In addition, residual 15 x 17 mm RIGHT temporal lobe nodule. Extensive surrounding edema, with similar 7 mm RIGHT to LEFT mass effect. Abnormally dense basal ganglia with superimposed nodular enhancement RIGHT basal ganglia, findings compatible with primary CNS lymphoma.  SIGNIFICANT EVENTS: 8/10>> OR for crani, R temporal lobectomy 02/21/15: Pathology pending on brain mass, initial stains consistent w neoplasm Appreciate ID consult  02/22/15: Per RN - moves Rt side  well but LUE/LLE - weak purposefully but not awake enough and will not follow commands. Gag + , Cough +. Path still pending. Not on pressors.  No fever. Not on sedation gtt. Not needed prn sedation  02/23/2015 :Tube feed holiday needed forHIV meds. 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 OFf sedatio - RASS -3 and moves toes to command. On decadron. Similar to 24h ago. GAG +, Cough +   SUBJECTIVE/OVERNIGHT/INTERVAL HX 02/24/15: 3% saline started yesterday due to increased edema and midline shift. Today, awake and following commands. Meets SBT criteria.   VITAL SIGNS: Temp:  [97.6 F (36.4 C)-99.6 F (37.6 C)] 98.1 F (36.7 C) (08/14 0358) Pulse Rate:  [52-80] 52 (08/14 0700) Resp:  [10-24] 15 (08/14 0700) BP: (111-133)/(64-83) 133/82 mmHg (08/14 0700) SpO2:  [96 %-100 %] 100 % (08/14 0700) FiO2 (%):  [40 %] 40 % (08/14 0358) Weight:  [72.2 kg (159 lb 2.8 oz)] 72.2 kg (159 lb 2.8 oz) (08/14 0448) HEMODYNAMICS:   VENTILATOR SETTINGS: Vent Mode:  [-] PRVC FiO2 (%):  [40 %] 40 % Set Rate:  [12 bmp] 12 bmp Vt Set:  [650 mL] 650 mL PEEP:  [5 cmH20] 5 cmH20 Pressure Support:  [5 cmH20] 5 cmH20 Plateau Pressure:  [16 cmH20-21 cmH20] 16 cmH20 INTAKE / OUTPUT:  Intake/Output Summary (Last 24 hours) at 02/24/15 0759 Last data filed at 02/24/15 0700  Gross per 24 hour  Intake 3556.5 ml  Output   2350 ml  Net 1206.5 ml    PHYSICAL EXAMINATION: General:  Thin young male, NAD on vent  Neuro:  RASS 0. off sedation, gag +, cough +. Moves all 4s to command and spont but obviously stronger on right HEENT:  Mm moist, ETT Cardiovascular:  s1s2 rrr Lungs:  resps even non labored on vent, coarse  Abdomen:  Soft, +bs  Musculoskeletal:  Warm and dry, no edema   LABS:  PULMONARY No results for input(s): PHART, PCO2ART, PO2ART, HCO3, TCO2, O2SAT in the last 168 hours.  Invalid input(s): PCO2, PO2  CBC  Recent Labs Lab 02/22/15 0515 02/23/15 0230 02/24/15 0510  HGB 9.9* 9.9* 10.0*  HCT 29.6* 30.2* 30.5*  WBC 8.2 6.8 7.2  PLT 319 321 326    COAGULATION  Recent  Labs Lab 02/18/15 1502  INR 1.24    CARDIAC  No results for input(s): TROPONINI in the last 168 hours. No results for input(s): PROBNP in the last 168 hours.   CHEMISTRY  Recent Labs Lab 02/20/15 1600 02/21/15 0300 02/22/15 0515 02/23/15 0230 02/23/15 1155 02/23/15 1830 02/23/15 2340 02/24/15 0510  NA 133* 134* 134* 134* 134* 135 135 138  K 3.7 4.2 4.0 4.1  --   --   --  4.4  CL 102 101 101 101  --   --   --  106  CO2 22 24 25 26   --   --   --  26  GLUCOSE 122* 151* 153* 139*  --   --   --  116*  BUN 10 7 8 12   --   --   --  13  CREATININE 0.74 0.76 0.72 0.69  --   --   --  0.65  CALCIUM 8.7* 8.9 9.0 8.9  --   --   --  8.8*  MG  --   --  2.1 2.1  --   --   --  2.1  PHOS  --   --  2.9 3.0  --   --   --  3.3   Estimated Creatinine Clearance: 139.1 mL/min (by C-G formula based on Cr of 0.65).   LIVER  Recent Labs Lab 02/18/15 1502  INR 1.24     INFECTIOUS  Recent Labs Lab 02/18/15 1500 02/19/15 1333 02/19/15 1613  LATICACIDVEN 1.2 1.0 1.0     ENDOCRINE CBG (last 3)   Recent Labs  02/23/15 1957 02/23/15 2348 02/24/15 0341  GLUCAP 116* 133* 120*         IMAGING x48h  - image(s) personally visualized  -   highlighted in bold Ct Head Wo Contrast  02/23/2015   CLINICAL DATA:  Coma, AIDS with primary lymphoma of brain.  EXAM: CT HEAD WITHOUT CONTRAST  TECHNIQUE: Contiguous axial images were obtained from the base of the skull through the vertex without intravenous contrast.  COMPARISON:  February 21, 2015.  FINDINGS: Status post right frontal and temporal craniotomy. Two masses are again noted laterally in the right temporal lobe with surrounding white matter edema. There appears to be a small amount of hemorrhage around these lesions which may be slightly increased compared to prior exam and consistent with postoperative hemorrhage. 1 cm of right to left midline shift is noted which is increased compared to prior exam. 2.7 x 2.5 cm mass is noted more  medially in right cerebral hemisphere with surrounding white matter edema. Ventricular size is within normal limits.  IMPRESSION: Increased right to left midline shift is noted most likely due to worsening white matter edema due to intraparenchymal masses as described above. There appears to be a slightly increased amount of postoperative hemorrhage seen inferiorly in the right temporal lobe. Critical Value/emergent results were called by telephone at the time of interpretation on 02/23/2015 at 10:13 am to [facility], the  patient's nurse, who verbally acknowledged these results and will contact the physician.   Electronically Signed   By: Marijo Conception, M.D.   On: 02/23/2015 10:13   Dg Chest Port 1 View  02/23/2015   CLINICAL DATA:  Endotracheal tube adjustment, central line placement.  EXAM: PORTABLE CHEST - 1 VIEW  COMPARISON:  February 23, 2015.  FINDINGS: Endotracheal tube has been repositioned with distal tip approximately 5 cm above the carina. Nasogastric tube is seen entering the stomach. Interval placement of left internal jugular catheter with distal tip in approximate position of cavoatrial junction. No pneumothorax or pleural effusion is noted. No acute pulmonary disease is noted. Bony thorax is intact.  IMPRESSION: Endotracheal and nasogastric tubes in grossly good position. Interval placement of left internal jugular catheter with its distal tip in expected position of cavoatrial junction.   Electronically Signed   By: Marijo Conception, M.D.   On: 02/23/2015 13:53   Dg Chest Port 1 View  02/23/2015   CLINICAL DATA:  Intubated  EXAM: PORTABLE CHEST - 1 VIEW  COMPARISON:  02/22/2015  FINDINGS: Cardiomediastinal silhouette is stable. Endotracheal tube with tip in upper trachea about 6.6 cm above the carina. NG tube in place. No acute infiltrate or pulmonary edema. There is no pneumothorax. Question tiny pneumomediastinum left pericardiac.  IMPRESSION: Endotracheal tube with tip in upper trachea  about 6.6 cm above the carina. NG tube in place. No acute infiltrate or pulmonary edema. There is no pneumothorax. Question tiny pneumomediastinum left pericardiac.   Electronically Signed   By: Lahoma Crocker M.D.   On: 02/23/2015 11:08          ASSESSMENT / PLAN:  PULMONARY OETT 8/10>>> Acute respiratory failure - post op neurosurgery    - meets SBT criteria and likely extubation criteria P:   Extubate if does well after SBT; Full vent support (was lethargic at admit)   NEUROLOGIC #Admit  - Acute encephalopathy   - Brain lesion - R temporal lobe - s/p crani/lobectomy.02/20/15  . 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 Prophylaxis and ART as above. Will also need weekly azithromycin  ENDOCRINE No active issue  P:   Monitor glucose on chem     FAMILY  - Updates:  Dr Tommy Medal discussed dx and status with parents on 8/10. Dr Lamonte Sakai updated parents at bedside 8/11. MOm updated  02/22/2015 at bedside. She is not at bedside 02/23/15 and   02/24/2015   - Inter-disciplinary  family meet or Palliative Care meeting due by:  8/17   SUMMARY  - aim for extubation 02/24/2015      Dr. Brand Males, M.D., [facility].C.P Pulmonary and Critical Care Medicine Staff Physician [place] [facility] Pager: [phone], If no answer or between  15:00h - 7:00h: call [phone]  02/24/2015 7:59 AM

## 2015-02-24 NOTE — Progress Notes (Addendum)
[name] for Infectious Disease    Subjective:  Extubated and walked to chair with assistance nodded her head in response to some of questions and was able to give me a thumbs up sign on the left not speaking at this point in time.  Antibiotics:  Anti-infectives    Start     Dose/Rate Route Frequency Ordered Stop   02/21/15 1000  dolutegravir (TIVICAY) tablet 50 mg    Comments:  Give per TUBE   50 mg Oral Daily 02/20/15 2222     02/21/15 1000  emtricitabine-tenofovir (TRUVADA) 200-300 MG per tablet 1 tablet     1 tablet Per Tube Daily 02/20/15 2222     02/21/15 1000  sulfamethoxazole-trimethoprim (BACTRIM,SEPTRA) 200-40 MG/5ML suspension 20 mL     20 mL Per Tube Daily 02/20/15 2224     02/20/15 2200  vancomycin (VANCOCIN) 1,250 mg in sodium chloride 0.9 % 250 mL IVPB  Status:  Discontinued     1,250 mg 166.7 mL/hr over 90 Minutes Intravenous Every 8 hours 02/20/15 1643 02/20/15 1801   02/20/15 1349  vancomycin (VANCOCIN) 1 GM/200ML IVPB  Status:  Discontinued    Comments:  Victor [name]   : cabinet override      02/20/15 1349 02/20/15 1607   02/20/15 1346  vancomycin (VANCOCIN) 1 GM/200ML IVPB  Status:  Discontinued    Comments:  Victor [name]   : cabinet override      02/20/15 1346 02/20/15 1351   02/20/15 1323  bacitracin 50,000 Units in sodium chloride irrigation 0.9 % 500 mL irrigation  Status:  Discontinued       As needed 02/20/15 1323 02/20/15 1518   02/19/15 2000  acyclovir (ZOVIRAX) 715 mg in dextrose 5 % 150 mL IVPB  Status:  Discontinued     10 mg/kg  71.7 kg 164.3 mL/hr over 60 Minutes Intravenous 3 times per day 02/19/15 1419 02/20/15 1801   02/18/15 2200  vancomycin (VANCOCIN) IVPB 1000 mg/200 mL premix  Status:  Discontinued     1,000 mg 200 mL/hr over 60 Minutes Intravenous Every 8 hours 02/18/15 1146 02/20/15 1643   02/18/15 2200  cefTRIAXone (ROCEPHIN) 2 g in dextrose 5 % 50 mL IVPB  Status:  Discontinued     2 g 100 mL/hr  over 30 Minutes Intravenous Every 12 hours 02/18/15 1618 02/20/15 1535   02/18/15 1115  vancomycin (VANCOCIN) 1,750 mg in sodium chloride 0.9 % 500 mL IVPB     1,750 mg 250 mL/hr over 120 Minutes Intravenous STAT 02/18/15 1101 02/18/15 1620   02/18/15 1100  acyclovir (ZOVIRAX) 860 mg in dextrose 5 % 150 mL IVPB  Status:  Discontinued     10 mg/kg  86.2 kg 167.2 mL/hr over 60 Minutes Intravenous 3 times per day 02/18/15 1030 02/19/15 1419   02/18/15 1045  vancomycin (VANCOCIN) IVPB 1000 mg/200 mL premix  Status:  Discontinued     1,000 mg 200 mL/hr over 60 Minutes Intravenous  Once 02/18/15 1030 02/18/15 1101   02/18/15 1045  cefTRIAXone (ROCEPHIN) 2 g in dextrose 5 % 50 mL IVPB  Status:  Discontinued     2 g 100 mL/hr over 30 Minutes Intravenous Every 12 hours 02/18/15 1030 02/18/15 1618      Medications: Scheduled Meds: . antiseptic oral rinse  7 mL Mouth Rinse q12n4p  . antiseptic oral rinse  7 mL Mouth Rinse QID  . chlorhexidine  15 mL  Mouth Rinse BID  . dexamethasone  6 mg Intravenous 4 times per day  . dolutegravir  50 mg Oral Daily  . emtricitabine-tenofovir  1 tablet Per Tube Daily  . feeding supplement (PRO-STAT SUGAR FREE 64)  30 mL Per Tube TID  . levETIRAcetam  500 mg Intravenous Q12H  . multivitamin with minerals  1 tablet Per Tube Daily  . pantoprazole (PROTONIX) IV  40 mg Intravenous QHS  . sulfamethoxazole-trimethoprim  20 mL Per Tube Daily   Continuous Infusions: . dextrose 5 % and 0.45 % NaCl with KCl 10 mEq/L Stopped (02/23/15 1300)  . feeding supplement (OSMOLITE 1.2 CAL) Stopped (02/24/15 0800)  . sodium chloride (hypertonic) 100 mL/hr (02/24/15 1400)   PRN Meds:.fentaNYL (SUBLIMAZE) injection, labetalol, naLOXone (NARCAN)  injection, [DISCONTINUED] ondansetron **OR** ondansetron (ZOFRAN) IV, promethazine    Objective: Weight change: -3 lb 8.4 oz (-1.6 kg)  Intake/Output Summary (Last 24 hours) at 02/24/15 1626 Last data filed at 02/24/15 1602  Gross  per 24 hour  Intake 3515.42 ml  Output   2550 ml  Net 965.42 ml   Blood pressure 116/73, pulse 67, temperature 97.4 F (36.3 C), temperature source Axillary, resp. rate 24, height 6\' 3"  (1.905 m), weight 159 lb 2.8 oz (72.2 kg), SpO2 96 %. Temp:  [96.9 F (36.1 C)-98.1 F (36.7 C)] 97.4 F (36.3 C) (08/14 1200) Pulse Rate:  [49-80] 67 (08/14 1600) Resp:  [12-27] 24 (08/14 1600) BP: (111-147)/(64-83) 116/73 mmHg (08/14 1600) SpO2:  [96 %-100 %] 96 % (08/14 1600) FiO2 (%):  [40 %] 40 % (08/14 0800) Weight:  [159 lb 2.8 oz (72.2 kg)] 159 lb 2.8 oz (72.2 kg) (08/14 0448)  Physical Exam: General: awake and able to sit in chair. At times he was trying to scratch his bandage and ultimately at 1. Apparently removed it and currently with a mitten to restrain him HEENT: anicteric sclera,  [employer] bradycardic, normal r,  no murmur rubs or gallops Chest: clear to auscultation bilaterally, no wheezing, rales or rhonchi Abdomen: soft  nondistended, normal bowel sounds, Extremities: no  clubbing or edema noted bilaterally Skin: no rashes Neuro: alert following commands  CBC: CBC Latest Ref Rng 02/24/2015 02/23/2015 02/22/2015  WBC 4.0 - 10.5 K/uL 7.2 6.8 8.2  Hemoglobin 13.0 - 17.0 g/dL 10.0(L) 9.9(L) 9.9(L)  Hematocrit 39.0 - 52.0 % 30.5(L) 30.2(L) 29.6(L)  Platelets 150 - 400 K/uL 326 321 319       BMET  Recent Labs  02/23/15 0230  02/24/15 0510 02/24/15 1223  NA 134*  < > 138 138  K 4.1  --  4.4  --   CL 101  --  106  --   CO2 26  --  26  --   GLUCOSE 139*  --  116*  --   BUN 12  --  13  --   CREATININE 0.69  --  0.65  --   CALCIUM 8.9  --  8.8*  --   < > = values in this interval not displayed.   Liver Panel  No results for input(s): PROT, ALBUMIN, AST, ALT, ALKPHOS, BILITOT, BILIDIR, IBILI in the last 72 hours.     Sedimentation Rate No results for input(s): ESRSEDRATE in the last 72 hours. C-Reactive Protein No results for input(s): CRP in the last 72  hours.  Micro Results: Recent Results (from the past 720 hour(s))  Culture, blood (routine x 2)     Status: None   Collection Time: 02/18/15 11:15 AM  Result Value Ref Range Status   Specimen Description BLOOD RIGHT ANTECUBITAL  Final   Special Requests BOTTLES DRAWN AEROBIC AND ANAEROBIC 5ML  Final   Culture  Setup Time   Final    GRAM POSITIVE COCCI IN CLUSTERS IN BOTH AEROBIC AND ANAEROBIC BOTTLES CRITICAL RESULT CALLED TO, READ BACK BY AND VERIFIED WITH: R. CLARK,RN AT [id number] ON [id number] BY Rhea Bleacher    Culture   Final    STAPHYLOCOCCUS SPECIES (COAGULASE NEGATIVE) THE SIGNIFICANCE OF ISOLATING THIS ORGANISM FROM A SINGLE SET OF BLOOD CULTURES WHEN MULTIPLE SETS ARE DRAWN IS UNCERTAIN. PLEASE NOTIFY THE MICROBIOLOGY DEPARTMENT WITHIN ONE WEEK IF SPECIATION AND SENSITIVITIES ARE REQUIRED. Performed at [facility]    Report Status 02/22/2015 FINAL  Final  Culture, blood (routine x 2)     Status: None   Collection Time: 02/18/15 11:23 AM  Result Value Ref Range Status   Specimen Description BLOOD LEFT ANTECUBITAL  Final   Special Requests BOTTLES DRAWN AEROBIC AND ANAEROBIC 5ML  Final   Culture   Final    NO GROWTH 5 DAYS Performed at [facility]    Report Status 02/23/2015 FINAL  Final  MRSA PCR Screening     Status: None   Collection Time: 02/19/15 12:02 AM  Result Value Ref Range Status   MRSA by PCR NEGATIVE NEGATIVE Final    Comment:        The GeneXpert MRSA Assay (FDA approved for NASAL specimens only), is one component of a comprehensive MRSA colonization surveillance program. It is not intended to diagnose MRSA infection nor to guide or monitor treatment for MRSA infections.     Studies/Results: Ct Head Wo Contrast  02/23/2015   CLINICAL DATA:  Coma, AIDS with primary lymphoma of brain.  EXAM: CT HEAD WITHOUT CONTRAST  TECHNIQUE: Contiguous axial images were obtained from the base of the skull through the vertex without intravenous contrast.   COMPARISON:  February 21, 2015.  FINDINGS: Status post right frontal and temporal craniotomy. Two masses are again noted laterally in the right temporal lobe with surrounding white matter edema. There appears to be a small amount of hemorrhage around these lesions which may be slightly increased compared to prior exam and consistent with postoperative hemorrhage. 1 cm of right to left midline shift is noted which is increased compared to prior exam. 2.7 x 2.5 cm mass is noted more medially in right cerebral hemisphere with surrounding white matter edema. Ventricular size is within normal limits.  IMPRESSION: Increased right to left midline shift is noted most likely due to worsening white matter edema due to intraparenchymal masses as described above. There appears to be a slightly increased amount of postoperative hemorrhage seen inferiorly in the right temporal lobe. Critical Value/emergent results were called by telephone at the time of interpretation on 02/23/2015 at 10:13 am to Leverne Humbles, the patient's nurse, who verbally acknowledged these results and will contact the physician.   Electronically Signed   By: Marijo Conception, M.D.   On: 02/23/2015 10:13   Dg Chest Port 1 View  02/24/2015   CLINICAL DATA:  Shortness of breath, intubated  EXAM: PORTABLE CHEST - 1 VIEW  COMPARISON:  02/23/2015  FINDINGS: Cardiomediastinal silhouette is stable. Stable endotracheal and NG tube position. There is left IJ central line with tip in right atrium. No acute infiltrate or pulmonary edema. No pneumothorax.  IMPRESSION: Stable support apparatus.  No active disease.  No pneumothorax.   Electronically Signed  By: Lahoma Crocker M.D.   On: 02/24/2015 09:57   Dg Chest Port 1 View  02/23/2015   CLINICAL DATA:  Endotracheal tube adjustment, central line placement.  EXAM: PORTABLE CHEST - 1 VIEW  COMPARISON:  February 23, 2015.  FINDINGS: Endotracheal tube has been repositioned with distal tip approximately 5 cm above the  carina. Nasogastric tube is seen entering the stomach. Interval placement of left internal jugular catheter with distal tip in approximate position of cavoatrial junction. No pneumothorax or pleural effusion is noted. No acute pulmonary disease is noted. Bony thorax is intact.  IMPRESSION: Endotracheal and nasogastric tubes in grossly good position. Interval placement of left internal jugular catheter with its distal tip in expected position of cavoatrial junction.   Electronically Signed   By: Marijo Conception, M.D.   On: 02/23/2015 13:53   Dg Chest Port 1 View  02/23/2015   CLINICAL DATA:  Intubated  EXAM: PORTABLE CHEST - 1 VIEW  COMPARISON:  02/22/2015  FINDINGS: Cardiomediastinal silhouette is stable. Endotracheal tube with tip in upper trachea about 6.6 cm above the carina. NG tube in place. No acute infiltrate or pulmonary edema. There is no pneumothorax. Question tiny pneumomediastinum left pericardiac.  IMPRESSION: Endotracheal tube with tip in upper trachea about 6.6 cm above the carina. NG tube in place. No acute infiltrate or pulmonary edema. There is no pneumothorax. Question tiny pneumomediastinum left pericardiac.   Electronically Signed   By: Lahoma Crocker M.D.   On: 02/23/2015 11:08      Assessment/Plan:  Active Problems:   Acute encephalopathy   Brain mass   Vasogenic brain edema   Severe protein-calorie malnutrition   Fever   Altered mental status   AIDS   Brain tumor   Respiratory failure   Acute respiratory failure   Primary central nervous system (CNS) lymphoma    Victor [name] is a 29 y.o. 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  I did tell the patient about his lymphoma and his HIV diagnosis but I doubt he was able to understand or will remember much at this  point  --would continue steroids --ARV therapy COULD BE CRITICAL TO cognitive improvement esp if there is a degree if HIV encephalitis --he will need his Lymphoma treated as well  GREATLY  APPRECIATE NEUROSURGERY, CCM critical care of the patient.  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   Can he get INPATIENT PET SCAN?  #3 HIV/AIDS: started Tivicay and truvada to bring down VL rapidly, bactrim for PCP prophylaxis  I spent greater than 45  minutes with the patient including greater than 50% of time in face to face counsel of the patient. Patient's mother and father re lymphoma diagnosis and HIV diagnosis and in coordination of their care.      I have updated the Mother.    LOS: 6 days   Alcide Evener 02/24/2015, 4:26 PM

## 2015-02-24 NOTE — Procedures (Signed)
Extubation Procedure Note  Patient Details:   Name: Heaton Sarin [facility] DOB: 1985-11-07 MRN: [id number]   Airway Documentation:  + air leak around cuff prior to extubation.    Evaluation  O2 sats: stable throughout Complications: No apparent complications Patient did tolerate procedure well. Bilateral Breath Sounds: Clear, Diminished Suctioning: Airway, Oral Yes, pt able to speak.  No stridor noted.  No distress noted, VSS.    Lenna Sciara 02/24/2015, 9:29 AM

## 2015-02-24 NOTE — Progress Notes (Signed)
   Re-reound  Meets extubation criteria  Plan Extubate  Dr. Brand Males, M.D., [facility].C.P Pulmonary and Critical Care Medicine Staff Physician [facility] Pager: [phone], If no answer or between  15:00h - 7:00h: call [phone]  02/24/2015 9:20 AM

## 2015-02-24 NOTE — Progress Notes (Signed)
SLP Cancellation Note  Patient Details Name: Victor [name] MRN: [id number] DOB: 02-Sep-1985   Cancelled treatment:       Reason Eval/Treat Not Completed: Fatigue/lethargy limiting ability to participate  Gabriel Rainwater Arab, [place] [phone]  KZSW[facility] UXNA TFTDD 02/24/2015, 3:26 PM

## 2015-02-24 NOTE — Progress Notes (Signed)
Pt seen and examined. No issues overnight. Was started on hypertonic saline yesterday. Pt has become more responsive. Able to nod to questions.  EXAM: Temp:  [96.9 F (36.1 C)-99.6 F (37.6 C)] 96.9 F (36.1 C) (08/14 0800) Pulse Rate:  [49-80] 49 (08/14 0800) Resp:  [10-23] 23 (08/14 0800) BP: (111-147)/(64-83) 147/81 mmHg (08/14 0800) SpO2:  [96 %-100 %] 100 % (08/14 0800) FiO2 (%):  [40 %] 40 % (08/14 0358) Weight:  [72.2 kg (159 lb 2.8 oz)] 72.2 kg (159 lb 2.8 oz) (08/14 0448) Intake/Output      08/13 0701 - 08/14 0700 08/14 0701 - 08/15 0700   I.V. (mL/kg) 1690.4 (23.4) 100 (1.4)   NG/GT 1656.1 65   IV Piggyback 210    Total Intake(mL/kg) 3556.5 (49.3) 165 (2.3)   Urine (mL/kg/hr) 2350 (1.4) 195 (1.7)   Total Output 2350 195   Net +1206.5 -30         Awake, alert Tracking Follows commands briskly BUE/BLE, appears somewhat more movement on right   LABS: Lab Results  Component Value Date   CREATININE 0.65 02/24/2015   BUN 13 02/24/2015   NA 138 02/24/2015   K 4.4 02/24/2015   CL 106 02/24/2015   CO2 26 02/24/2015   Lab Results  Component Value Date   WBC 7.2 02/24/2015   HGB 10.0* 02/24/2015   HCT 30.5* 02/24/2015   MCV 85.2 02/24/2015   PLT 326 02/24/2015    IMAGING: Repeat CTH yesterday demonstrates increased edema of right temporal lobe with some effacement of basal cisterns.  IMPRESSION: - 29 y.o. male s/p resection of right temporal tumor - B-cell lymphoma with HIV - Significant improvement in neurologic status  PLAN: - Cont hypertonic saline for one more day - Cont 4q6 dexamethasone - Attempt to extubate

## 2015-02-25 DIAGNOSIS — C8581 Other specified types of non-Hodgkin lymphoma, lymph nodes of head, face, and neck: Secondary | ICD-10-CM

## 2015-02-25 DIAGNOSIS — R768 Other specified abnormal immunological findings in serum: Secondary | ICD-10-CM

## 2015-02-25 DIAGNOSIS — B20 Human immunodeficiency virus [HIV] disease: Secondary | ICD-10-CM

## 2015-02-25 DIAGNOSIS — G9340 Encephalopathy, unspecified: Secondary | ICD-10-CM

## 2015-02-25 LAB — BASIC METABOLIC PANEL
Anion gap: 5 (ref 5–15)
BUN: 12 mg/dL (ref 6–20)
CHLORIDE: 114 mmol/L — AB (ref 101–111)
CO2: 25 mmol/L (ref 22–32)
CREATININE: 0.58 mg/dL — AB (ref 0.61–1.24)
Calcium: 8.8 mg/dL — ABNORMAL LOW (ref 8.9–10.3)
GFR calc Af Amer: >60 mL/min (ref >60)
GFR calc non Af Amer: >60 mL/min (ref >60)
GLUCOSE: 106 mg/dL — AB (ref 65–99)
Potassium: 3.9 mmol/L (ref 3.5–5.1)
Sodium: 144 mmol/L (ref 135–145)

## 2015-02-25 LAB — CBC WITH DIFFERENTIAL/PLATELET
BASOS PCT: 0 % (ref 0–1)
Basophils Absolute: 0 10*3/uL (ref 0.0–0.1)
EOS ABS: 0 10*3/uL (ref 0.0–0.7)
EOS PCT: 0 % (ref 0–5)
HEMATOCRIT: 30.3 % — AB (ref 39.0–52.0)
HEMOGLOBIN: 9.8 g/dL — AB (ref 13.0–17.0)
LYMPHS PCT: 6 % — AB (ref 12–46)
Lymphs Abs: 0.5 10*3/uL — ABNORMAL LOW (ref 0.7–4.0)
MCH: 27.8 pg (ref 26.0–34.0)
MCHC: 32.3 g/dL (ref 30.0–36.0)
MCV: 85.8 fL (ref 78.0–100.0)
MONOS PCT: 4 % (ref 3–12)
Monocytes Absolute: 0.3 10*3/uL (ref 0.1–1.0)
NEUTROS ABS: 6.9 10*3/uL (ref 1.7–7.7)
Neutrophils Relative %: 90 % — ABNORMAL HIGH (ref 43–77)
Platelets: 312 10*3/uL (ref 150–400)
RBC: 3.53 MIL/uL — ABNORMAL LOW (ref 4.22–5.81)
RDW: 14.3 % (ref 11.5–15.5)
WBC: 7.7 10*3/uL (ref 4.0–10.5)

## 2015-02-25 LAB — SODIUM
SODIUM: 142 mmol/L (ref 135–145)
SODIUM: 145 mmol/L (ref 135–145)

## 2015-02-25 LAB — PHOSPHORUS: Phosphorus: 4.2 mg/dL (ref 2.5–4.6)

## 2015-02-25 LAB — HLA B*5701: HLA B 5701: NEGATIVE

## 2015-02-25 LAB — MAGNESIUM: Magnesium: 2 mg/dL (ref 1.7–2.4)

## 2015-02-25 MED ORDER — SULFAMETHOXAZOLE-TRIMETHOPRIM 800-160 MG PO TABS
1.0000 | ORAL_TABLET | Freq: Every day | ORAL | Status: DC
  Administered 2015-02-25 – 2015-02-27 (×3): 1 via ORAL
  Filled 2015-02-25 (×3): qty 1

## 2015-02-25 MED ORDER — DOCUSATE SODIUM 100 MG PO CAPS
100.0000 mg | ORAL_CAPSULE | Freq: Two times a day (BID) | ORAL | Status: DC
  Administered 2015-02-25 – 2015-02-27 (×4): 100 mg via ORAL
  Filled 2015-02-25 (×6): qty 1

## 2015-02-25 MED ORDER — SODIUM CHLORIDE 0.9 % IV SOLN
INTRAVENOUS | Status: DC
  Administered 2015-02-25 – 2015-02-27 (×2): via INTRAVENOUS

## 2015-02-25 NOTE — Progress Notes (Signed)
Rehab Admissions Coordinator Note:  Patient was screened by Cleatrice Burke for appropriateness for an Inpatient Acute Rehab Consult per PT recommendation.   At this time, we are recommending Inpatient Rehab consult.  Cleatrice Burke 02/25/2015, 3:08 PM  I can be reached at [phone] .

## 2015-02-25 NOTE — Evaluation (Signed)
Physical Therapy Evaluation Patient Details Name: Victor [name] MRN: [id number] DOB: 1985/11/22 Today's Date: 02/25/2015   History of Present Illness  29yo male with no PMH except for shingles in L eye several weeks prior to admission (rx with antiviral) presented 8/8 with 2 week hx headache, weight loss and progressive lethargy and confusion. Found to have several lesions in R temporal lobe with mass effect. Seen in consultation by neurosurgery and taken 8/10 to OR for craniotomy and R temporal lobectomy. NEW dx HIV. Worsening R -> L brain edema and midline shift 02/23/15 - improved clinically with normal mental status 02/24/15 after 3% saline. Extubated 8/14 am.   Clinical Impression  Pt admitted with/for temporal lobe tumor s/p craini with post op edema.  Pt currently limited functionally due to the problems listed. ( See problems list.)   Pt will benefit from PT to maximize function and safety in order to get ready for next venue listed below.     Follow Up Recommendations CIR    Equipment Recommendations  Other (comment) (TBA)    Recommendations for Other Services Rehab consult     Precautions / Restrictions Precautions Precautions: Fall      Mobility  Bed Mobility Overal bed mobility: Needs Assistance Bed Mobility: Supine to Sit;Sit to Supine     Supine to sit: Total assist Sit to supine: Max assist   General bed mobility comments: Initially not aroused, but after sitting had truncal tone to assist to supine.  Transfers Overall transfer level: Needs assistance   Transfers: Sit to/from Stand Sit to Stand: Max assist         General transfer comment: support at both knees, stability and lift assist.  Pt's R LE took over w/b and minimal voluntary L LE assist with excitatory input.  Ambulation/Gait                Stairs            Wheelchair Mobility    Modified Rankin (Stroke Patients Only)       Balance Overall balance assessment:  Needs assistance Sitting-balance support: Feet supported;No upper extremity supported Sitting balance-Leahy Scale: Poor Sitting balance - Comments: short periods holding balance before falling post or to the L     Standing balance-Leahy Scale: Zero                               Pertinent Vitals/Pain Pain Assessment: No/denies pain    Home Living Family/patient expects to be discharged to:: Private residence Living Arrangements: Alone                    Prior Function Level of Independence: Independent               Hand Dominance        Extremity/Trunk Assessment   Upper Extremity Assessment: Defer to OT evaluation (no spotaneous/voluntary movement L, pt reports some sens.)           Lower Extremity Assessment: RLE deficits/detail;LLE deficits/detail RLE Deficits / Details: Once aroused, some spontaneous/vol movement.  bore weight in standing LLE Deficits / Details: no spontaneous movement/vol movement noted until standing and noted movement in feet and tendency to bear some weight before knee collapsed.     Communication   Communication:  (limited  communication, soft spoken)  Cognition Arousal/Alertness: Lethargic Behavior During Therapy: Flat affect Overall Cognitive Status: Impaired/Different from baseline Area of Impairment: Orientation;Attention;Following  commands;Awareness;Problem solving Orientation Level: Place;Time;Situation Current Attention Level: Focused   Following Commands: Follows one step commands inconsistently   Awareness: Intellectual Problem Solving: Slow processing;Decreased initiation;Difficulty sequencing;Requires verbal cues;Requires tactile cues      General Comments General comments (skin integrity, edema, etc.): VSS through out.  Pt aroused on sitting and maintained open eyes 75% of time.  Noted  eyes statically conjugate with uncoordinate with moderate nystagmus with pursuits especially to the right.     Exercises        Assessment/Plan    PT Assessment Patient needs continued PT services  PT Diagnosis Other (comment) (hemiparesis)   PT Problem List Decreased strength;Decreased activity tolerance;Decreased balance;Decreased mobility;Decreased coordination;Decreased cognition;Decreased knowledge of use of DME;Impaired sensation  PT Treatment Interventions Gait training;Functional mobility training;Therapeutic activities;Therapeutic exercise;Balance training;Neuromuscular re-education;Patient/family education   PT Goals (Current goals can be found in the Care Plan section) Acute Rehab PT Goals Patient Stated Goal: unable to state. PT Goal Formulation: Patient unable to participate in goal setting Time For Goal Achievement: 03/11/15 Potential to Achieve Goals: Good    Frequency Min 3X/week   Barriers to discharge        Co-evaluation               End of Session   Activity Tolerance: Patient tolerated treatment well Patient left: in bed;with call bell/phone within reach;with bed alarm set Nurse Communication: Mobility status         Time: 5784-6962 PT Time Calculation (min) (ACUTE ONLY): 23 min   Charges:   PT Evaluation $Initial PT Evaluation Tier I: 1 Procedure PT Treatments $Therapeutic Activity: 8-22 mins   PT G Codes:        [name], [name] 02/25/2015, 1:23 PM  02/25/2015  [name], PT [phone]  (pager)

## 2015-02-25 NOTE — Consult Note (Signed)
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 male with recent diagnosis of herpes zoster affecting left eye and headaches for the past month and weakness for a few days PTA. He was admitted on 02/18/15 after being found on the floor confused and unable to follow commands. Family reported use of IV drugs as well as weight loss in the last 3 months. CT head done showing 3.5 cm right basal ganglia mass with mass effect on significant vasogenic edema right temporal and right frontal lobe with mass effect on right frontal horn and 5 mm right to left midline shift.  He was treated with decadron as well as IV antibiotics. MRI brain done revealing markedly abnormal brain with right > left hemisphere multifocal mostly intermediate to lowT2 signal masslike lesions with enhancement and edema. Question of CNS lymphoma v/s encephalitis v/s multicentric glioblastoma and  Dr. Hal Neer was consulted for input and recommended brain biopsy for diagnosis. 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 He was evaluated by Dr. Tommy Medal and started on Tivicay, Truvada and Bactrim daily. He was evaluated by Dr Alvy Bimler who recommended staging and patient to be presented to tumor conference for input. He had increase in obtundation and repeat CT head 08/13 showed increase in right to left midline shift with worsening of edema. Decadron increased with improvement in mentation and he tolerated extubation on 02/24/15.  Swallow evaluation done today and patient started on dysphagia 1 pudding liquids. PT evaluation done today and CIR recommended for follow up therapy.      Review of Systems  Unable to perform ROS: mental acuity      Past Medical History  Diagnosis Date  . Dental abscess 02/15/2012  . AIDS 02/20/2015    Past Surgical History  Procedure Laterality Date  .  Craniotomy Right 02/20/2015    Procedure: Right Temporal Craniotomy for Excision of Mass;  Surgeon: Karie Chimera, MD;  Location: [facility];  Service: Neurosurgery;  Laterality: Right;  Right Temporal Craniotomy for Excision of Mass    History reviewed. No pertinent family history.    Social History:  Lives alone in a hotel. Per  reports that he has quit smoking. His smoking use included Cigarettes. He has never used smokeless tobacco. Per reports that he drinks alcohol and uses IV drugs.     Allergies  Allergen Reactions  . Shrimp [Shellfish Allergy] Swelling  . Fish Allergy Swelling     Medications Prior to Admission  Medication Sig Dispense Refill  . cyclopentolate (CYCLODRYL,CYCLOGYL) 1 % ophthalmic solution Place 1 drop into the left eye 4 (four) times daily.     Marland Kitchen erythromycin ophthalmic ointment Place a 1/2 inch ribbon of ointment into the lower eyelid. 1 g 0  . prednisoLONE acetate (PRED FORTE) 1 % ophthalmic suspension Place 1 drop into the right eye 4 (four) times daily.     . valACYclovir (VALTREX) 1000 MG tablet Take 1,000 mg by mouth 3 (three) times daily. For 10 days    . oxyCODONE-acetaminophen (PERCOCET) 5-325 MG per tablet Take 1 tablet by mouth every 4 (four) hours as needed for moderate pain. (Patient not taking: Reported on 02/18/2015) 10 tablet 0    Home: Home Living Family/patient expects to be discharged to:: Private residence Living Arrangements: Alone  Functional History: Prior Function Level of Independence: Independent Functional  Status:  Mobility: Bed Mobility Overal bed mobility: Needs Assistance Bed Mobility: Supine to Sit, Sit to Supine Supine to sit: Total assist Sit to supine: Max assist General bed mobility comments: Initially not aroused, but after sitting had truncal tone to assist to supine. Transfers Overall transfer level: Needs assistance Transfers: Sit to/from Stand Sit to Stand: Max assist General transfer comment: support at both  knees, stability and lift assist.  Pt's R LE took over w/b and minimal voluntary L LE assist with excitatory input.      ADL:    Cognition: Cognition Overall Cognitive Status: Impaired/Different from baseline Orientation Level: Other (comment) (pt would not answer orientatin questions) Cognition Arousal/Alertness: Lethargic Behavior During Therapy: Flat affect Overall Cognitive Status: Impaired/Different from baseline Area of Impairment: Orientation, Attention, Following commands, Awareness, Problem solving Orientation Level: Place, Time, Situation Current Attention Level: Focused Following Commands: Follows one step commands inconsistently Awareness: Intellectual Problem Solving: Slow processing, Decreased initiation, Difficulty sequencing, Requires verbal cues, Requires tactile cues   Blood pressure 119/73, pulse 49, temperature 96 F (35.6 C), temperature source Axillary, resp. rate 19, height 6\' 3"  (1.905 m), weight 68.1 kg (150 lb 2.1 oz), SpO2 100 %. Physical Exam  Nursing note and vitals reviewed. Constitutional: He appears well-developed and well-nourished. He is uncooperative.  HENT:  Head: Normocephalic and atraumatic.  Right crani incisions clean and dry with staples intact.   Eyes:  Did not respond to sternal rubs.  Cardiovascular: Normal rate and regular rhythm.   Respiratory: Effort normal and breath sounds normal. He has no wheezes.  GI: Soft. Bowel sounds are normal. He exhibits no distension. There is no tenderness.  Musculoskeletal: He exhibits no edema or tenderness.  Neurological:  Did not respond to sternal rubs but resisted attempts at examining eyes/pupils.  Able to move RUE/RLE spontaneously with attempts at ROM and to pain. Has decreased movement of LUE/LLE.  Skin: Skin is warm and dry.    Results for orders placed or performed during the hospital encounter of 02/18/15 (from the past 24 hour(s))  Sodium     Status: None   Collection Time: 02/24/15   5:10 PM  Result Value Ref Range   Sodium 142 135 - 145 mmol/L  Sodium     Status: None   Collection Time: 02/24/15 11:30 PM  Result Value Ref Range   Sodium 142 135 - 145 mmol/L  CBC with Differential/Platelet     Status: Abnormal   Collection Time: 02/25/15  6:15 AM  Result Value Ref Range   WBC 7.7 4.0 - 10.5 K/uL   RBC 3.53 (L) 4.22 - 5.81 MIL/uL   Hemoglobin 9.8 (L) 13.0 - 17.0 g/dL   HCT 30.3 (L) 39.0 - 52.0 %   MCV 85.8 78.0 - 100.0 fL   MCH 27.8 26.0 - 34.0 pg   MCHC 32.3 30.0 - 36.0 g/dL   RDW 14.3 11.5 - 15.5 %   Platelets 312 150 - 400 K/uL   Neutrophils Relative % 90 (H) 43 - 77 %   Lymphocytes Relative 6 (L) 12 - 46 %   Monocytes Relative 4 3 - 12 %   Eosinophils Relative 0 0 - 5 %   Basophils Relative 0 0 - 1 %   Neutro Abs 6.9 1.7 - 7.7 K/uL   Lymphs Abs 0.5 (L) 0.7 - 4.0 K/uL   Monocytes Absolute 0.3 0.1 - 1.0 K/uL   Eosinophils Absolute 0.0 0.0 - 0.7 K/uL   Basophils Absolute 0.0 0.0 - 0.1  K/uL   RBC Morphology POLYCHROMASIA PRESENT   Basic metabolic panel     Status: Abnormal   Collection Time: 02/25/15  6:15 AM  Result Value Ref Range   Sodium 144 135 - 145 mmol/L   Potassium 3.9 3.5 - 5.1 mmol/L   Chloride 114 (H) 101 - 111 mmol/L   CO2 25 22 - 32 mmol/L   Glucose, Bld 106 (H) 65 - 99 mg/dL   BUN 12 6 - 20 mg/dL   Creatinine, Ser 0.58 (L) 0.61 - 1.24 mg/dL   Calcium 8.8 (L) 8.9 - 10.3 mg/dL   GFR calc non Af Amer >60 >60 mL/min   GFR calc Af Amer >60 >60 mL/min   Anion gap 5 5 - 15  Phosphorus     Status: None   Collection Time: 02/25/15  6:15 AM  Result Value Ref Range   Phosphorus 4.2 2.5 - 4.6 mg/dL  Magnesium     Status: None   Collection Time: 02/25/15  6:15 AM  Result Value Ref Range   Magnesium 2.0 1.7 - 2.4 mg/dL  Sodium     Status: None   Collection Time: 02/25/15 10:15 AM  Result Value Ref Range   Sodium 145 135 - 145 mmol/L   Dg Chest Port 1 View  02/24/2015   CLINICAL DATA:  Shortness of breath, intubated  EXAM: PORTABLE CHEST -  1 VIEW  COMPARISON:  02/23/2015  FINDINGS: Cardiomediastinal silhouette is stable. Stable endotracheal and NG tube position. There is left IJ central line with tip in right atrium. No acute infiltrate or pulmonary edema. No pneumothorax.  IMPRESSION: Stable support apparatus.  No active disease.  No pneumothorax.   Electronically Signed   By: Lahoma Crocker M.D.   On: 02/24/2015 09:57    Assessment/Plan: Diagnosis: CNS lymphoma s/p craniotomy 1. Does the need for close, 24 hr/day medical supervision in concert with the patient's rehab needs make it unreasonable for this patient to be served in a less intensive setting? Yes and Potentially 2. Co-Morbidities requiring supervision/potential complications: AIDS 3. Due to bladder management, bowel management, safety, skin/wound care, disease management, medication administration, pain management and patient education, does the patient require 24 hr/day rehab nursing? Yes and Potentially 4. Does the patient require coordinated care of a physician, rehab nurse, PT (1-2 hrs/day, 5 days/week), OT (1-2 hrs/day, 5 days/week) and SLP (1-2 hrs/day, 5 days/week) to address physical and functional deficits in the context of the above medical diagnosis(es)? Yes Addressing deficits in the following areas: balance, endurance, locomotion, strength, transferring, bowel/bladder control, bathing, dressing, feeding, grooming, toileting, cognition, speech, language, swallowing and psychosocial support 5. Can the patient actively participate in an intensive therapy program of at least 3 hrs of therapy per day at least 5 days per week? Potentially 6. The potential for patient to make measurable gains while on inpatient rehab is fair 7. Anticipated functional outcomes upon discharge from inpatient rehab are supervision, min assist and mod assist  with PT, supervision, min assist and mod assist with OT, supervision and min assist with SLP. 8. Estimated rehab length of stay to reach  the above functional goals is: TBD 9. Does the patient have adequate social supports and living environment to accommodate these discharge functional goals? Potentially 10. Anticipated D/C setting: Home 11. Anticipated post D/C treatments: HH therapy and Outpatient therapy 12. Overall Rehab/Functional Prognosis: fair  RECOMMENDATIONS: This patient's condition is appropriate for continued rehabilitative care in the following setting: CIR if family comitted to taking him  home. Will need 24 care at discharge Patient has agreed to participate in recommended program. N/A Note that insurance prior authorization may be required for reimbursement for recommended care.  Comment: Rehab Admissions Coordinator to follow up.  Thanks,  Meredith Staggers, MD, Mellody Drown     02/25/2015

## 2015-02-25 NOTE — Progress Notes (Signed)
PULMONARY / CRITICAL CARE MEDICINE   Name: Victor [name] MRN: [id number] DOB: Dec 13, 1985    ADMISSION DATE:  02/18/2015 CONSULTATION DATE:  8/10  REFERRING MD :  Kritzer  CHIEF COMPLAINT:  Vent management   INITIAL PRESENTATION:  29yo male with no PMH except for shingles in L eye several weeks prior to admission (rx with antiviral) presented 8/8 with 2 week hx headache, weight loss and progressive lethargy and confusion.  Found to have several lesions in R temporal lobe with mass effect.  Seen in consultation by neurosurgery and taken 8/10 to OR for craniotomy and R temporal lobectomy.  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 PCCM consulted post op for vent management.   STUDIES:  MR brain 8/8>>> Markedly abnormal brain with multifocal mostly intermediate to low T2 signal masslike lesions with enhancement and edema. Right greater than left hemisphere involvement, also with nonenhancing signal abnormality tracking along the periaqueductal gray matter and fourth ventricle.   Intracranial mass effect with leftward midline shift of 8 mm and ventricular effacement. Basilar cisterns remain patent. No ventriculomegaly. Head CT 8/11 >>  Interval RIGHT frontal temporal craniotomy for debulking of RIGHT temporal lobe mass with residual 12 x 16 mm component along the superior margin of the resection cavity. In addition, residual 15 x 17 mm RIGHT temporal lobe nodule. Extensive surrounding edema, with similar 7 mm RIGHT to LEFT mass effect. Abnormally dense basal ganglia with superimposed nodular enhancement RIGHT basal ganglia, findings compatible with primary CNS lymphoma.  SIGNIFICANT EVENTS: 8/10>> OR for crani, R temporal lobectomy 02/21/15: Pathology pending on brain mass, initial stains consistent w neoplasm Appreciate ID consult  02/22/15: Per RN - moves Rt side  well but LUE/LLE - weak purposefully but not awake enough and will not follow commands. Gag + , Cough +. Path still pending. Not on pressors.  No fever. Not on sedation gtt. Not needed prn sedation  02/23/2015 :Tube feed holiday needed forHIV meds. 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 OFf sedatio - RASS -3 and moves toes to command. On decadron. Similar to 24h ago. GAG +, Cough +   SUBJECTIVE/OVERNIGHT/INTERVAL HX No events overnight, extubated.   VITAL SIGNS: Temp:  [97.1 F (36.2 C)-98.7 F (37.1 C)] 98.7 F (37.1 C) (08/15 0748) Pulse Rate:  [48-76] 51 (08/15 1000) Resp:  [17-27] 17 (08/15 1000) BP: (116-137)/(70-82) 127/76 mmHg (08/15 1000) SpO2:  [96 %-100 %] 99 % (08/15 1000) Weight:  [68.1 kg (150 lb 2.1 oz)] 68.1 kg (150 lb 2.1 oz) (08/15 0439) HEMODYNAMICS:   VENTILATOR SETTINGS:   INTAKE / OUTPUT:  Intake/Output Summary (Last 24 hours) at 02/25/15 1032 Last data filed at 02/25/15 1000  Gross per 24 hour  Intake   2610 ml  Output   2120 ml  Net    490 ml   PHYSICAL EXAMINATION: General:  Thin young male, NAD  Neuro:  gag +, cough +. Moves all 4s to command and spont but obviously stronger on right HEENT:  Mm moist, ETT Cardiovascular:  s1s2 rrr Lungs:  resps even non labored on vent, coarse  Abdomen:  Soft, +bs  Musculoskeletal:  Warm and dry, no edema  LABS:  PULMONARY No results for input(s): PHART, PCO2ART, PO2ART, HCO3, TCO2, O2SAT in the last 168 hours.  Invalid input(s): PCO2, PO2  CBC  Recent Labs Lab 02/23/15 0230 02/24/15 0510 02/25/15 0615  HGB 9.9* 10.0* 9.8*  HCT 30.2* 30.5* 30.3*  WBC 6.8 7.2 7.7  PLT 321 326 312   COAGULATION  Recent Labs Lab 02/18/15 1502  INR 1.24   CARDIAC  No results for input(s): TROPONINI in the last 168 hours. No results for input(s): PROBNP in the last 168 hours.  CHEMISTRY  Recent Labs Lab 02/21/15 0300 02/22/15 0515 02/23/15 0230  02/24/15 0510 02/24/15 1223 02/24/15 1710 02/24/15 2330 02/25/15 0615  NA 134* 134* 134*  < > 138 138 142 142 144  K 4.2 4.0 4.1  --  4.4  --    --   --  3.9  CL 101 101 101  --  106  --   --   --  114*  CO2 24 25 26   --  26  --   --   --  25  GLUCOSE 151* 153* 139*  --  116*  --   --   --  106*  BUN 7 8 12   --  13  --   --   --  12  CREATININE 0.76 0.72 0.69  --  0.65  --   --   --  0.58*  CALCIUM 8.9 9.0 8.9  --  8.8*  --   --   --  8.8*  MG  --  2.1 2.1  --  2.1  --   --   --  2.0  PHOS  --  2.9 3.0  --  3.3  --   --   --  4.2  < > = values in this interval not displayed. Estimated Creatinine Clearance: 131.2 mL/min (by C-G formula based on Cr of 0.58).  LIVER  Recent Labs Lab 02/18/15 1502  INR 1.24   INFECTIOUS  Recent Labs Lab 02/18/15 1500 02/19/15 1333 02/19/15 1613  LATICACIDVEN 1.2 1.0 1.0   ENDOCRINE CBG (last 3)   Recent Labs  02/24/15 0812 02/24/15 1225 02/24/15 1551  GLUCAP 115* 117* 117*   IMAGING x48h  - image(s) personally visualized  -   highlighted in bold Dg Chest Port 1 View  02/24/2015   CLINICAL DATA:  Shortness of breath, intubated  EXAM: PORTABLE CHEST - 1 VIEW  COMPARISON:  02/23/2015  FINDINGS: Cardiomediastinal silhouette is stable. Stable endotracheal and NG tube position. There is left IJ central line with tip in right atrium. No acute infiltrate or pulmonary edema. No pneumothorax.  IMPRESSION: Stable support apparatus.  No active disease.  No pneumothorax.   Electronically Signed   By: Lahoma Crocker M.D.   On: 02/24/2015 09:57   Dg Chest Port 1 View  02/23/2015   CLINICAL DATA:  Endotracheal tube adjustment, central line placement.  EXAM: PORTABLE CHEST - 1 VIEW  COMPARISON:  February 23, 2015.  FINDINGS: Endotracheal tube has been repositioned with distal tip approximately 5 cm above the carina. Nasogastric tube is seen entering the stomach. Interval placement of left internal jugular catheter with distal tip in approximate position of cavoatrial junction. No pneumothorax or pleural effusion is noted. No acute pulmonary disease is noted. Bony thorax is intact.  IMPRESSION: Endotracheal  and nasogastric tubes in grossly good position. Interval placement of left internal jugular catheter with its distal tip in expected position of cavoatrial junction.   Electronically Signed   By: Marijo Conception, M.D.   On: 02/23/2015 13:53   ASSESSMENT / PLAN:  PULMONARY OETT 8/10>>>8/14 Acute respiratory failure - post op neurosurgery  P:   Titrate O2 for sat of 88-92%. Monitor closely for airway protection. Speech pathology following.  NEUROLOGIC Admit  -  Acute encephalopathy   - Brain lesion - R temporal lobe - s/p crani/lobectomy.02/20/15  . Confirmed B Cell lymphoma Post op  - Worsening R -> L brain edema and midline shift 02/23/15 - improved clinically with normal mental status 02/24/15 after 3% saline P:   Will speak to NS about stopping 3% patient is following commands. Continue keppra, decadron. Oncology following.  CARDIOVASCULAR HTN  P:  Monitor   RENAL On 3% saline. P:   BMET in AM. Replace electrolytes as indicated. KVO IVF.  GASTROINTESTINAL No active issue  P:   PPI  Diet per speech pathology.  HEMATOLOGIC / ONCOLOGICAL  CNS lymphomoa P:  SCD's. F/u CBC. Onc consult appreciated.  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 Prophylaxis and ART as above. Will also need weekly azithromycin  ENDOCRINE No active issue  P:   Monitor glucose on chem   FAMILY  - Updates:  Updated parents bedside.  - Inter-disciplinary family meet or Palliative Care meeting due by:  8/17  Hold in the ICU given tenuous airway protection and 3% saline.  Will discuss  discontinuation of 3% saline with NS.  The patient is critically ill with multiple organ systems failure and requires high complexity decision making for assessment and support, frequent evaluation and titration of therapies, application of advanced monitoring technologies and extensive interpretation of multiple databases.   Critical Care Time devoted to patient care services described in this note is  35  Minutes. This time reflects time of care of this signee Dr Jennet Maduro. This critical care time does not reflect procedure time, or teaching time or supervisory time of PA/NP/Med student/Med Resident etc but could involve care discussion time.  Rush Farmer, M.D. [facility] Pulmonary/Critical Care Medicine. Pager: [phone]. After hours pager: [phone].  02/25/2015 10:32 AM

## 2015-02-25 NOTE — Progress Notes (Signed)
Patient ID: Victor [name], male   DOB: 1986/03/22, 29 y.o.   MRN: [id number] Afeb, vss Quite lethargic, but now extubated. According to nursing, he will awaken with a lot of stim, and once awake he is oriented to person and time, and will follow sone commands on the right side. He is not doing any of that for me this morning, but if so, that represents a lot of progress. Will continue present rx.

## 2015-02-25 NOTE — Evaluation (Signed)
Clinical/Bedside Swallow Evaluation Patient Details  Name: Victor [name] MRN: [id number] Date of Birth: December 26, 1985  Today's Date: 02/25/2015 Time: SLP Start Time (ACUTE ONLY): 1000 SLP Stop Time (ACUTE ONLY): 1019 SLP Time Calculation (min) (ACUTE ONLY): 19 min  Past Medical History:  Past Medical History  Diagnosis Date  . Dental abscess 02/15/2012  . AIDS 02/20/2015   Past Surgical History:  Past Surgical History  Procedure Laterality Date  . Craniotomy Right 02/20/2015    Procedure: Right Temporal Craniotomy for Excision of Mass;  Surgeon: Karie Chimera, MD;  Location: [place] [facility];  Service: Neurosurgery;  Laterality: Right;  Right Temporal Craniotomy for Excision of Mass   HPI:  29yo male with no PMH except for shingles in L eye several weeks prior to admission (rx with antiviral) presented 8/8 with 2 week hx headache, weight loss and progressive lethargy and confusion. Found to have several lesions in R temporal lobe with mass effect. Seen in consultation by neurosurgery and taken 8/10 to OR for craniotomy and R temporal lobectomy. NEW dx HIV. Worsening R -> L brain edema and midline shift 02/23/15 - improved clinically with normal mental status 02/24/15 after 3% saline. Extubated 8/14 am.    Assessment / Plan / Recommendation Clinical Impression  Pt demonstrates signs concerning for penetration/aspiration of liquids including delayed swallow, wet vocal quality and immediate and delayed coughing. In setting of recent 4 day intubation and hoarse vocal quality, suspect acute reversible dysphagia due to laryngeal edema/sensory deficits, complicated by decreased responsiveness and inability to follow compensatory strategies. Pt may consume purees and puddings easily, but no liquids at this time. Will f/u tomorrow for possible upgrade at bedside vs objective testing as needed.     Aspiration Risk  Moderate    Diet Recommendation Dysphagia 1 (Puree);Pudding        Other   Recommendations Oral Care Recommendations: Oral care BID   Follow Up Recommendations       Frequency and Duration min 2x/week  2 weeks   Pertinent Vitals/Pain NA    SLP Swallow Goals     Swallow Study Prior Functional Status       General Other Pertinent Information: 29yo male with no PMH except for shingles in L eye several weeks prior to admission (rx with antiviral) presented 8/8 with 2 week hx headache, weight loss and progressive lethargy and confusion. Found to have several lesions in R temporal lobe with mass effect. Seen in consultation by neurosurgery and taken 8/10 to OR for craniotomy and R temporal lobectomy. NEW dx HIV. Worsening R -> L brain edema and midline shift 02/23/15 - improved clinically with normal mental status 02/24/15 after 3% saline. Extubated 8/14 am.  Type of Study: Bedside swallow evaluation Previous Swallow Assessment: none Diet Prior to this Study: NPO Temperature Spikes Noted: No Respiratory Status: Supplemental O2 delivered via (comment) ([place]) History of Recent Intubation: Yes Length of Intubations (days): 4 days Date extubated: 02/24/15 Behavior/Cognition: Lethargic/Drowsy;Requires cueing Oral Cavity - Dentition: Adequate natural dentition/normal for age Self-Feeding Abilities: Needs assist Patient Positioning: Postural control interferes with function (right leaning despite constant repositinoing - left neglect) Baseline Vocal Quality: Hoarse;Low vocal intensity Volitional Cough: Weak Volitional Swallow: Unable to elicit    Oral/Motor/Sensory Function Overall Oral Motor/Sensory Function: Appears within functional limits for tasks assessed (would not follow commands)   Ice Chips     Thin Liquid Thin Liquid: Impaired Presentation: Cup;Straw Pharyngeal  Phase Impairments: Suspected delayed Swallow;Wet Vocal Quality;Cough - Immediate    Nectar  Thick Nectar Thick Liquid: Impaired Presentation: Cup Pharyngeal Phase Impairments: Suspected  delayed Swallow;Cough - Delayed   Honey Thick Honey Thick Liquid: Not tested   Puree Puree: Within functional limits   Solid   GO    Solid: Not tested      Herbie Baltimore, MA CCC-SLP [phone]  Lynann Beaver 02/25/2015,11:05 AM

## 2015-02-25 NOTE — Progress Notes (Signed)
[place] for Infectious Disease  Subjective:  Able to give one word responses this morning. Appears lethargic. Will follow some commands.   Antibiotics:  Anti-infectives    Start     Dose/Rate Route Frequency Ordered Stop   02/21/15 1000  dolutegravir (TIVICAY) tablet 50 mg    Comments:  Give per TUBE   50 mg Oral Daily 02/20/15 2222     02/21/15 1000  emtricitabine-tenofovir (TRUVADA) 200-300 MG per tablet 1 tablet     1 tablet Per Tube Daily 02/20/15 2222     02/21/15 1000  sulfamethoxazole-trimethoprim (BACTRIM,SEPTRA) 200-40 MG/5ML suspension 20 mL     20 mL Per Tube Daily 02/20/15 2224     02/20/15 2200  vancomycin (VANCOCIN) 1,250 mg in sodium chloride 0.9 % 250 mL IVPB  Status:  Discontinued     1,250 mg 166.7 mL/hr over 90 Minutes Intravenous Every 8 hours 02/20/15 1643 02/20/15 1801   02/20/15 1349  vancomycin (VANCOCIN) 1 GM/200ML IVPB  Status:  Discontinued    Comments:  [name]   : cabinet override      02/20/15 1349 02/20/15 1607   02/20/15 1346  vancomycin (VANCOCIN) 1 GM/200ML IVPB  Status:  Discontinued    Comments:  [name]   : cabinet override      02/20/15 1346 02/20/15 1351   02/20/15 1323  bacitracin 50,000 Units in sodium chloride irrigation 0.9 % 500 mL irrigation  Status:  Discontinued       As needed 02/20/15 1323 02/20/15 1518   02/19/15 2000  acyclovir (ZOVIRAX) 715 mg in dextrose 5 % 150 mL IVPB  Status:  Discontinued     10 mg/kg  71.7 kg 164.3 mL/hr over 60 Minutes Intravenous 3 times per day 02/19/15 1419 02/20/15 1801   02/18/15 2200  vancomycin (VANCOCIN) IVPB 1000 mg/200 mL premix  Status:  Discontinued     1,000 mg 200 mL/hr over 60 Minutes Intravenous Every 8 hours 02/18/15 1146 02/20/15 1643   02/18/15 2200  cefTRIAXone (ROCEPHIN) 2 g in dextrose 5 % 50 mL IVPB  Status:  Discontinued     2 g 100 mL/hr over 30 Minutes Intravenous Every 12 hours 02/18/15 1618 02/20/15 1535   02/18/15 1115  vancomycin (VANCOCIN) 1,750  mg in sodium chloride 0.9 % 500 mL IVPB     1,750 mg 250 mL/hr over 120 Minutes Intravenous STAT 02/18/15 1101 02/18/15 1620   02/18/15 1100  acyclovir (ZOVIRAX) 860 mg in dextrose 5 % 150 mL IVPB  Status:  Discontinued     10 mg/kg  86.2 kg 167.2 mL/hr over 60 Minutes Intravenous 3 times per day 02/18/15 1030 02/19/15 1419   02/18/15 1045  vancomycin (VANCOCIN) IVPB 1000 mg/200 mL premix  Status:  Discontinued     1,000 mg 200 mL/hr over 60 Minutes Intravenous  Once 02/18/15 1030 02/18/15 1101   02/18/15 1045  cefTRIAXone (ROCEPHIN) 2 g in dextrose 5 % 50 mL IVPB  Status:  Discontinued     2 g 100 mL/hr over 30 Minutes Intravenous Every 12 hours 02/18/15 1030 02/18/15 1618      Medications: Scheduled Meds: . antiseptic oral rinse  7 mL Mouth Rinse QID  . chlorhexidine  15 mL Mouth Rinse BID  . dexamethasone  6 mg Intravenous 4 times per day  . dolutegravir  50 mg Oral Daily  . emtricitabine-tenofovir  1 tablet Per Tube Daily  . feeding supplement (PRO-STAT SUGAR FREE 64)  30 mL Per Tube TID  .  levETIRAcetam  500 mg Intravenous Q12H  . multivitamin with minerals  1 tablet Per Tube Daily  . pantoprazole (PROTONIX) IV  40 mg Intravenous QHS  . sulfamethoxazole-trimethoprim  20 mL Per Tube Daily   Continuous Infusions: . feeding supplement (OSMOLITE 1.2 CAL) Stopped (02/24/15 0800)  . sodium chloride (hypertonic) 100 mL/hr (02/25/15 0611)   PRN Meds:.fentaNYL (SUBLIMAZE) injection, labetalol, naLOXone (NARCAN)  injection, [DISCONTINUED] ondansetron **OR** ondansetron (ZOFRAN) IV, promethazine    Objective: Weight change: -9 lb 0.6 oz (-4.1 kg)  Intake/Output Summary (Last 24 hours) at 02/25/15 1015 Last data filed at 02/25/15 1000  Gross per 24 hour  Intake   2610 ml  Output   2120 ml  Net    490 ml   Blood pressure 127/76, pulse 51, temperature 98.7 F (37.1 C), temperature source Oral, resp. rate 17, height 6\' 3"  (1.905 m), weight 150 lb 2.1 oz (68.1 kg), SpO2 99  %. Temp:  [97.1 F (36.2 C)-98.7 F (37.1 C)] 98.7 F (37.1 C) (08/15 0748) Pulse Rate:  [48-76] 51 (08/15 1000) Resp:  [17-27] 17 (08/15 1000) BP: (116-137)/(70-82) 127/76 mmHg (08/15 1000) SpO2:  [96 %-100 %] 99 % (08/15 1000) Weight:  [150 lb 2.1 oz (68.1 kg)] 150 lb 2.1 oz (68.1 kg) (08/15 0439)  Physical Exam: General: lethargic, able to give one word responses  HEENT: anicteric sclera CVS bradycardic, RRR,  no murmur rubs or gallops Chest: clear to auscultation bilaterally, no wheezing, rales or rhonchi Abdomen: BS+, soft  nondistended, non-tender  Extremities: no clubbing or edema noted bilaterally Skin: no rashes Neuro: able to arouse easily, giving one word responses, oriented to person and year, following some commands  CBC: CBC Latest Ref Rng 02/25/2015 02/24/2015 02/23/2015  WBC 4.0 - 10.5 K/uL 7.7 7.2 6.8  Hemoglobin 13.0 - 17.0 g/dL 9.8(L) 10.0(L) 9.9(L)  Hematocrit 39.0 - 52.0 % 30.3(L) 30.5(L) 30.2(L)  Platelets 150 - 400 K/uL 312 326 321    BMET  Recent Labs  02/24/15 0510  02/24/15 2330 02/25/15 0615  NA 138  < > 142 144  K 4.4  --   --  3.9  CL 106  --   --  114*  CO2 26  --   --  25  GLUCOSE 116*  --   --  106*  BUN 13  --   --  12  CREATININE 0.65  --   --  0.58*  CALCIUM 8.8*  --   --  8.8*  < > = values in this interval not displayed.   Micro Results: Recent Results (from the past 720 hour(s))  Culture, blood (routine x 2)     Status: None   Collection Time: 02/18/15 11:15 AM  Result Value Ref Range Status   Specimen Description BLOOD RIGHT ANTECUBITAL  Final   Special Requests BOTTLES DRAWN AEROBIC AND ANAEROBIC 5ML  Final   Culture  Setup Time   Final    GRAM POSITIVE COCCI IN CLUSTERS IN BOTH AEROBIC AND ANAEROBIC BOTTLES CRITICAL RESULT CALLED TO, READ BACK BY AND VERIFIED WITH: R. CLARK,RN AT [id number] ON [id number] BY Rhea Bleacher    Culture   Final    STAPHYLOCOCCUS SPECIES (COAGULASE NEGATIVE) THE SIGNIFICANCE OF ISOLATING THIS ORGANISM  FROM A SINGLE SET OF BLOOD CULTURES WHEN MULTIPLE SETS ARE DRAWN IS UNCERTAIN. PLEASE NOTIFY THE MICROBIOLOGY DEPARTMENT WITHIN ONE WEEK IF SPECIATION AND SENSITIVITIES ARE REQUIRED. Performed at [facility]    Report Status 02/22/2015 FINAL  Final  Culture,  blood (routine x 2)     Status: None   Collection Time: 02/18/15 11:23 AM  Result Value Ref Range Status   Specimen Description BLOOD LEFT ANTECUBITAL  Final   Special Requests BOTTLES DRAWN AEROBIC AND ANAEROBIC 5ML  Final   Culture   Final    NO GROWTH 5 DAYS Performed at [facility]    Report Status 02/23/2015 FINAL  Final  MRSA PCR Screening     Status: None   Collection Time: 02/19/15 12:02 AM  Result Value Ref Range Status   MRSA by PCR NEGATIVE NEGATIVE Final    Comment:        The GeneXpert MRSA Assay (FDA approved for NASAL specimens only), is one component of a comprehensive MRSA colonization surveillance program. It is not intended to diagnose MRSA infection nor to guide or monitor treatment for MRSA infections.     Studies/Results: Dg Chest Port 1 View  02/24/2015   CLINICAL DATA:  Shortness of breath, intubated  EXAM: PORTABLE CHEST - 1 VIEW  COMPARISON:  02/23/2015  FINDINGS: Cardiomediastinal silhouette is stable. Stable endotracheal and NG tube position. There is left IJ central line with tip in right atrium. No acute infiltrate or pulmonary edema. No pneumothorax.  IMPRESSION: Stable support apparatus.  No active disease.  No pneumothorax.   Electronically Signed   By: Lahoma Crocker M.D.   On: 02/24/2015 09:57   Dg Chest Port 1 View  02/23/2015   CLINICAL DATA:  Endotracheal tube adjustment, central line placement.  EXAM: PORTABLE CHEST - 1 VIEW  COMPARISON:  February 23, 2015.  FINDINGS: Endotracheal tube has been repositioned with distal tip approximately 5 cm above the carina. Nasogastric tube is seen entering the stomach. Interval placement of left internal jugular catheter with distal tip in  approximate position of cavoatrial junction. No pneumothorax or pleural effusion is noted. No acute pulmonary disease is noted. Bony thorax is intact.  IMPRESSION: Endotracheal and nasogastric tubes in grossly good position. Interval placement of left internal jugular catheter with its distal tip in expected position of cavoatrial junction.   Electronically Signed   By: Marijo Conception, M.D.   On: 02/23/2015 13:53      Assessment/Plan:  Victor [name] is a 29 y.o. 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   Dr. Tommy Medal and I told the patient again of his lymphoma and HIV/AIDS diagnoses.   --Continue ARV therapy   --Oncology evaluated him for his lymphoma  GREATLY  APPRECIATE NEUROSURGERY, CCM, Oncology's care of the patient.  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    #3 HIV/AIDS: Continue Tivicay and Truvada. Continue Bactrim for PCP prophylaxis.   Dr. Tommy Medal gave family his card for patient to follow in ID clinic as outpatient.   Attending note to follow.   #4 Reactive RPR: RPR positive with quantitative 1:2. Likely represents previous treated infection given low titer. No prior RPR. No further work up needed.     LOS: 7 days    Albin Felling, MD, MPH Internal Medicine Resident, PGY-II Pager: [phone] 02/25/2015, 10:15 AM

## 2015-02-25 NOTE — Progress Notes (Signed)
Nutrition Follow-up  DOCUMENTATION CODES:   Severe malnutrition in context of chronic illness  INTERVENTION:    Diet advancement per SLP after swallow evaluation.  If unable to advance diet, recommend resume Osmolite 1.2 via NGT at 65 ml/h with Prostat 30 ml TID to provide 2172 kcals, 131 gm protein, 1279 ml free water daily.  NUTRITION DIAGNOSIS:   Malnutrition related to chronic illness as evidenced by severe depletion of body fat, severe depletion of muscle mass.  Ongoing  GOAL:   Patient will meet greater than or equal to 90% of their needs  Unmet  MONITOR:   TF tolerance, Diet advancement, I & O's, Labs, Weight trends  REASON FOR ASSESSMENT:   Consult Enteral/tube feeding initiation and management  ASSESSMENT:   29yo male with no PMH except for shingles in L eye several weeks prior to admission (rx with antiviral) presented 8/8 with 2 week hx headache, weight loss and progressive lethargy and confusion. Found to have several lesions in R temporal lobe with mass effect. Seen in consultation by neurosurgery and taken 8/10 to OR for craniotomy and R temporal lobectomy. 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 New dx lymphoma.   TF off since extubation. SLP following for ability to perform swallow evaluation. Currently NPO. Labs and medications reviewed.   Diet Order:  Diet NPO time specified  Skin:  Reviewed, no issues  Last BM:  unknown  Height:   Ht Readings from Last 1 Encounters:  02/18/15 6\' 3"  (1.905 m)    Weight:   Wt Readings from Last 1 Encounters:  02/25/15 150 lb 2.1 oz (68.1 kg)    Ideal Body Weight:  89 kg  BMI:  Body mass index is 18.77 kg/(m^2).  Estimated Nutritional Needs:   Kcal:  2100-2400  Protein:  110-125 gm  Fluid:  2.1-2.4 L  EDUCATION NEEDS:   No education needs identified at this time  Molli Barrows, [place], [place], [place] Pager [phone] After Hours Pager [phone]

## 2015-02-26 DIAGNOSIS — D638 Anemia in other chronic diseases classified elsewhere: Secondary | ICD-10-CM

## 2015-02-26 DIAGNOSIS — J9600 Acute respiratory failure, unspecified whether with hypoxia or hypercapnia: Secondary | ICD-10-CM

## 2015-02-26 DIAGNOSIS — R609 Edema, unspecified: Secondary | ICD-10-CM

## 2015-02-26 LAB — CBC WITH DIFFERENTIAL/PLATELET
BASOS ABS: 0 10*3/uL (ref 0.0–0.1)
BASOS PCT: 0 % (ref 0–1)
EOS ABS: 0 10*3/uL (ref 0.0–0.7)
Eosinophils Relative: 0 % (ref 0–5)
HCT: 32.4 % — ABNORMAL LOW (ref 39.0–52.0)
Hemoglobin: 10.7 g/dL — ABNORMAL LOW (ref 13.0–17.0)
LYMPHS ABS: 0.5 10*3/uL — AB (ref 0.7–4.0)
Lymphocytes Relative: 6 % — ABNORMAL LOW (ref 12–46)
MCH: 28.3 pg (ref 26.0–34.0)
MCHC: 33 g/dL (ref 30.0–36.0)
MCV: 85.7 fL (ref 78.0–100.0)
MONO ABS: 0.3 10*3/uL (ref 0.1–1.0)
Monocytes Relative: 4 % (ref 3–12)
NEUTROS ABS: 7.3 10*3/uL (ref 1.7–7.7)
Neutrophils Relative %: 90 % — ABNORMAL HIGH (ref 43–77)
PLATELETS: 332 10*3/uL (ref 150–400)
RBC: 3.78 MIL/uL — ABNORMAL LOW (ref 4.22–5.81)
RDW: 14.1 % (ref 11.5–15.5)
WBC: 8.1 10*3/uL (ref 4.0–10.5)

## 2015-02-26 LAB — MAGNESIUM: Magnesium: 1.9 mg/dL (ref 1.7–2.4)

## 2015-02-26 LAB — BASIC METABOLIC PANEL
Anion gap: 5 (ref 5–15)
BUN: 16 mg/dL (ref 6–20)
CO2: 26 mmol/L (ref 22–32)
CREATININE: 0.62 mg/dL (ref 0.61–1.24)
Calcium: 9.1 mg/dL (ref 8.9–10.3)
Chloride: 105 mmol/L (ref 101–111)
GFR calc Af Amer: >60 mL/min (ref >60)
GLUCOSE: 108 mg/dL — AB (ref 65–99)
POTASSIUM: 3.9 mmol/L (ref 3.5–5.1)
SODIUM: 136 mmol/L (ref 135–145)

## 2015-02-26 LAB — PHOSPHORUS: PHOSPHORUS: 4.2 mg/dL (ref 2.5–4.6)

## 2015-02-26 MED ORDER — MAGNESIUM HYDROXIDE 400 MG/5ML PO SUSP: 30.0000 mL | Freq: Once | ORAL | Status: DC

## 2015-02-26 MED ORDER — AZITHROMYCIN 600 MG PO TABS
1200.0000 mg | ORAL_TABLET | ORAL | Status: DC
  Administered 2015-02-26: 1200 mg via ORAL
  Filled 2015-02-26: qty 2

## 2015-02-26 MED ORDER — FLUCONAZOLE 100 MG PO TABS
100.0000 mg | ORAL_TABLET | ORAL | Status: DC
  Administered 2015-02-26: 100 mg via ORAL
  Filled 2015-02-26: qty 1

## 2015-02-26 NOTE — Progress Notes (Signed)
Rehab admissions - Evaluated for possible admission.  I called patient's mother.  Mom plans for patient to come home with her after rehab.  She needs for patient to be better and to be able to walk.  She can provide supervision and be with patient for the next 3 months.  Mom took FMLA for 3 months.  PT was working with patient when I rounded and OT plans to see patient tomorrow.  I will follow along for progress.  Call me for questions.  #[phone]

## 2015-02-26 NOTE — Progress Notes (Signed)
Patient ID: Victor [name], male   DOB: [birth year], 29 y.o.   MRN: [id number]         [facility] for Infectious Disease    Date of Admission:  02/18/2015     Active Problems:   Acute encephalopathy   Brain mass   Vasogenic brain edema   Severe protein-calorie malnutrition   Fever   Altered mental status   AIDS   Brain tumor   Respiratory failure   Acute respiratory failure   Primary central nervous system (CNS) lymphoma   AIDS with primary lymphoma of brain   . antiseptic oral rinse  7 mL Mouth Rinse QID  . chlorhexidine  15 mL Mouth Rinse BID  . dexamethasone  6 mg Intravenous 4 times per day  . docusate sodium  100 mg Oral BID  . dolutegravir  50 mg Oral Daily  . emtricitabine-tenofovir  1 tablet Per Tube Daily  . levETIRAcetam  500 mg Intravenous Q12H  . magnesium hydroxide  30 mL Oral Once  . pantoprazole (PROTONIX) IV  40 mg Intravenous QHS  . sulfamethoxazole-trimethoprim  1 tablet Oral Daily    Review of Systems: Review of systems not obtained due to patient factors.  Past Medical History  Diagnosis Date  . Dental abscess 02/15/2012  . AIDS 02/20/2015    Social History  Substance Use Topics  . Smoking status: Former Smoker    Types: Cigarettes  . Smokeless tobacco: Never Used  . Alcohol Use: Yes    History reviewed. No pertinent family history. Allergies  Allergen Reactions  . Shrimp [Shellfish Allergy] Swelling  . Fish Allergy Swelling    OBJECTIVE: Filed Vitals:   02/26/15 1200 02/26/15 1300 02/26/15 1400 02/26/15 1500  BP: 123/85 120/71 125/75 115/78  Pulse: 59 63 67 64  Temp:      TempSrc:      Resp: 23 22 28 29   Height:      Weight:      SpO2: 99% 98% 97% 95%   Body mass index is 18.6 kg/(m^2).  General: He is sitting up in a chair Skin: No rash Lungs: Clear Cor: Regular S1 and S2 with no murmurs Abdomen: Soft without masses Neuro: He does not open eyes or respond to questions  Lab Results Lab Results  Component  Value Date   WBC 8.1 02/26/2015   HGB 10.7* 02/26/2015   HCT 32.4* 02/26/2015   MCV 85.7 02/26/2015   PLT 332 02/26/2015    Lab Results  Component Value Date   CREATININE 0.62 02/26/2015   BUN 16 02/26/2015   NA 136 02/26/2015   K 3.9 02/26/2015   CL 105 02/26/2015   CO2 26 02/26/2015    Lab Results  Component Value Date   ALT 33 12/07/2012   AST 35 12/07/2012   ALKPHOS 53 12/07/2012   BILITOT 0.5 12/07/2012    CD4 T CELL ABS (/uL)  Date Value  02/20/2015 10*  02/18/2015 60*    Microbiology: Recent Results (from the past 240 hour(s))  Culture, blood (routine x 2)     Status: None   Collection Time: 02/18/15 11:15 AM  Result Value Ref Range Status   Specimen Description BLOOD RIGHT ANTECUBITAL  Final   Special Requests BOTTLES DRAWN AEROBIC AND ANAEROBIC 5ML  Final   Culture  Setup Time   Final    GRAM POSITIVE COCCI IN CLUSTERS IN BOTH AEROBIC AND ANAEROBIC BOTTLES CRITICAL RESULT CALLED TO, READ BACK BY AND VERIFIED WITH: RRayburn Go  AT [id number] ON [id number] BY Rhea Bleacher    Culture   Final    STAPHYLOCOCCUS SPECIES (COAGULASE NEGATIVE) THE SIGNIFICANCE OF ISOLATING THIS ORGANISM FROM A SINGLE SET OF BLOOD CULTURES WHEN MULTIPLE SETS ARE DRAWN IS UNCERTAIN. PLEASE NOTIFY THE MICROBIOLOGY DEPARTMENT WITHIN ONE WEEK IF SPECIATION AND SENSITIVITIES ARE REQUIRED. Performed at [facility]    Report Status 02/22/2015 FINAL  Final  Culture, blood (routine x 2)     Status: None   Collection Time: 02/18/15 11:23 AM  Result Value Ref Range Status   Specimen Description BLOOD LEFT ANTECUBITAL  Final   Special Requests BOTTLES DRAWN AEROBIC AND ANAEROBIC 5ML  Final   Culture   Final    NO GROWTH 5 DAYS Performed at [facility]    Report Status 02/23/2015 FINAL  Final  MRSA PCR Screening     Status: None   Collection Time: 02/19/15 12:02 AM  Result Value Ref Range Status   MRSA by PCR NEGATIVE NEGATIVE Final    Comment:        The GeneXpert MRSA Assay  (FDA approved for NASAL specimens only), is one component of a comprehensive MRSA colonization surveillance program. It is not intended to diagnose MRSA infection nor to guide or monitor treatment for MRSA infections.     Problem List Items Addressed This Visit      Unprioritized   Acute encephalopathy - Primary   Acute respiratory failure   Relevant Orders   DG Chest Port 1 View (Completed)   Brain tumor   Relevant Orders   CT Head W Wo Contrast (Completed)   Primary central nervous system (CNS) lymphoma    He has newly diagnosed CNS lymphoma complicating advanced HIV infection. I will continue his current anti-retroviral regimen and prophylactic trimethoprim sulfamethoxazole. Given his CD4 count is less than 100 I will also start prophylactic doses of fluconazole and azithromycin.      Relevant Medications   dexamethasone (DECADRON) injection 10 mg (Completed)   diphenhydrAMINE (BENADRYL) injection 12.5 mg (Completed)   promethazine (PHENERGAN) tablet 12.5-25 mg   levETIRAcetam (KEPPRA) 500 mg in sodium chloride 0.9 % 100 mL IVPB   dexamethasone (DECADRON) injection 6 mg (Completed)   dexamethasone (DECADRON) injection 4 mg (Completed)   fentaNYL (SUBLIMAZE) injection 12.5-50 mcg   dexamethasone (DECADRON) injection 6 mg   Respiratory failure   Relevant Orders   DG Chest Portable 1 View (Completed)    Other Visit Diagnoses    Encounter for orogastric (OG) tube placement        Relevant Orders    DG Abd Portable 1V (Completed)    Endotracheal tube present        Coma        Relevant Orders    CT Head Wo Contrast (Completed)        Michel Bickers, MD [facility] [place] [phone] pager   [phone] cell 02/26/2015, 4:03 PM

## 2015-02-26 NOTE — Progress Notes (Signed)
PULMONARY / CRITICAL CARE MEDICINE   Name: Victor [name] MRN: [id number] DOB: [birth year]    ADMISSION DATE:  02/18/2015 CONSULTATION DATE:  8/10  REFERRING MD :  Kritzer  CHIEF COMPLAINT:  Vent management   INITIAL PRESENTATION:  29yo male with no PMH except for shingles in L eye several weeks prior to admission (rx with antiviral) presented 8/8 with 2 week hx headache, weight loss and progressive lethargy and confusion.  Found to have several lesions in R temporal lobe with mass effect.  Seen in consultation by neurosurgery and taken 8/10 to OR for craniotomy and R temporal lobectomy.  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 PCCM consulted post op for vent management.   STUDIES:  MR brain 8/8>>> Markedly abnormal brain with multifocal mostly intermediate to low T2 signal masslike lesions with enhancement and edema. Right greater than left hemisphere involvement, also with nonenhancing signal abnormality tracking along the periaqueductal gray matter and fourth ventricle.   Intracranial mass effect with leftward midline shift of 8 mm and ventricular effacement. Basilar cisterns remain patent. No ventriculomegaly. Head CT 8/11 >>  Interval RIGHT frontal temporal craniotomy for debulking of RIGHT temporal lobe mass with residual 12 x 16 mm component along the superior margin of the resection cavity. In addition, residual 15 x 17 mm RIGHT temporal lobe nodule. Extensive surrounding edema, with similar 7 mm RIGHT to LEFT mass effect. Abnormally dense basal ganglia with superimposed nodular enhancement RIGHT basal ganglia, findings compatible with primary CNS lymphoma.  SIGNIFICANT EVENTS: 8/10>> OR for crani, R temporal lobectomy 02/21/15: Pathology pending on brain mass, initial stains consistent w neoplasm Appreciate ID consult  02/22/15: Per RN - moves Rt side  well but LUE/LLE - weak purposefully but not awake enough and will not follow commands. Gag + , Cough +. Path still pending. Not on pressors.  No fever. Not on sedation gtt. Not needed prn sedation  02/23/2015 :Tube feed holiday needed forHIV meds. 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 OFf sedatio - RASS -3 and moves toes to command. On decadron. Similar to 24h ago. GAG +, Cough +   SUBJECTIVE/OVERNIGHT/INTERVAL HX No events overnight, extubated.   VITAL SIGNS: Temp:  [96 F (35.6 C)-98.3 F (36.8 C)] 98.3 F (36.8 C) (08/16 1129) Pulse Rate:  [45-80] 51 (08/16 1100) Resp:  [17-27] 23 (08/16 1100) BP: (115-136)/(64-84) 120/83 mmHg (08/16 1100) SpO2:  [96 %-100 %] 99 % (08/16 1100) Weight:  [67.5 kg (148 lb 13 oz)] 67.5 kg (148 lb 13 oz) (08/16 0500) HEMODYNAMICS:   VENTILATOR SETTINGS:   INTAKE / OUTPUT:  Intake/Output Summary (Last 24 hours) at 02/26/15 1145 Last data filed at 02/26/15 1100  Gross per 24 hour  Intake   1305 ml  Output   2025 ml  Net   -720 ml   PHYSICAL EXAMINATION: General:  Thin young male, NAD  Neuro:  gag +, cough +. Moves all 4s to command to command, able to swallow. HEENT:  Mm moist, ETT Cardiovascular:  s1s2 rrr Lungs:  resps even non labored on vent, coarse  Abdomen:  Soft, +bs  Musculoskeletal:  Warm and dry, no edema  LABS:  PULMONARY No results for input(s): PHART, PCO2ART, PO2ART, HCO3, TCO2, O2SAT in the last 168 hours.  Invalid input(s): PCO2, PO2  CBC  Recent Labs Lab 02/24/15 0510 02/25/15 0615 02/26/15 0600  HGB 10.0* 9.8* 10.7*  HCT 30.5* 30.3* 32.4*  WBC 7.2 7.7 8.1  PLT 326  312 332   COAGULATION No results for input(s): INR in the last 168 hours. CARDIAC  No results for input(s): TROPONINI in the last 168 hours. No results for input(s): PROBNP in the last 168 hours.  CHEMISTRY  Recent Labs Lab 02/22/15 0515 02/23/15 0230  02/24/15 0510  02/24/15 1710 02/24/15 2330 02/25/15 0615 02/25/15 1015 02/26/15 0600  NA 134* 134*  < > 138  < > 142 142 144 145 136  K 4.0 4.1  --  4.4  --   --   --   3.9  --  3.9  CL 101 101  --  106  --   --   --  114*  --  105  CO2 25 26  --  26  --   --   --  25  --  26  GLUCOSE 153* 139*  --  116*  --   --   --  106*  --  108*  BUN 8 12  --  13  --   --   --  12  --  16  CREATININE 0.72 0.69  --  0.65  --   --   --  0.58*  --  0.62  CALCIUM 9.0 8.9  --  8.8*  --   --   --  8.8*  --  9.1  MG 2.1 2.1  --  2.1  --   --   --  2.0  --  1.9  PHOS 2.9 3.0  --  3.3  --   --   --  4.2  --  4.2  < > = values in this interval not displayed. Estimated Creatinine Clearance: 130.1 mL/min (by C-G formula based on Cr of 0.62).  LIVER No results for input(s): AST, ALT, ALKPHOS, BILITOT, PROT, ALBUMIN, INR in the last 168 hours. INFECTIOUS  Recent Labs Lab 02/19/15 1333 02/19/15 1613  LATICACIDVEN 1.0 1.0   ENDOCRINE CBG (last 3)   Recent Labs  02/24/15 0812 02/24/15 1225 02/24/15 1551  GLUCAP 115* 117* 117*   IMAGING I reviewed CXR myself, no acute abnormalities.  ASSESSMENT / PLAN:  PULMONARY OETT 8/10>>>8/14 Acute respiratory failure - post op neurosurgery  P:   Titrate O2 for sat of 88-92%. Monitor closely for airway protection. Speech pathology following.  NEUROLOGIC Admit  - Acute encephalopathy   - Brain lesion - R temporal lobe - s/p crani/lobectomy.02/20/15  . Confirmed B Cell lymphoma Post op  - Worsening R -> L brain edema and midline shift 02/23/15 - improved clinically with normal mental status 02/24/15 after 3% saline P:   Discussed with NS, d/c 3% saline. Continue keppra, decadron. Oncology following.  CARDIOVASCULAR HTN  P:  Monitor   RENAL Off 3% saline. P:   BMET in AM. Replace electrolytes as indicated. KVO IVF.  GASTROINTESTINAL No active issue  P:   PPI  Diet per speech pathology.  HEMATOLOGIC / ONCOLOGICAL  CNS lymphomoa P:  SCD's. F/u CBC. Onc consult appreciated.  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 Prophylaxis and ART as above. Will also need weekly azithromycin  ENDOCRINE No active issue  P:   Monitor glucose on chem   FAMILY  - Updates:  Updated parents bedside, transfer to SDU and to [facility] service with PCCM off 8/16.  - Inter-disciplinary family meet or Palliative Care meeting due by:  8/17  Rush Farmer, M.D. [facility] Pulmonary/Critical Care Medicine. Pager: [phone]. After hours pager: [phone].  02/26/2015 11:45 AM

## 2015-02-26 NOTE — Assessment & Plan Note (Signed)
He has newly diagnosed CNS lymphoma complicating advanced HIV infection. I will continue his current anti-retroviral regimen and prophylactic trimethoprim sulfamethoxazole. Given his CD4 count is less than 100 I will also start prophylactic doses of fluconazole and azithromycin.

## 2015-02-26 NOTE — Progress Notes (Signed)
Patient ID: Victor [name], male   DOB: 1986-03-06, 29 y.o.   MRN: [id number] Afeb, vss No new neuro issues. Slowly improving from neuro stand point, but not doing much for me at this time. Wound healing well. Continue present rx.

## 2015-02-26 NOTE — Progress Notes (Signed)
Speech Language Pathology Treatment: Dysphagia  Patient Details Name: Victor [name] MRN: [id number] DOB: 04-17-1986 Today's Date: 02/26/2015 Time: 1140-1200 SLP Time Calculation (min) (ACUTE ONLY): 20 min  Assessment / Plan / Recommendation Clinical Impression  F/u after yesterday's swallow evaluation: Pt with improved alertness, although with delayed response and initiation time, perseverative responses.  Demonstrates improved ability to tolerate POs with active, though slowed mastication; noted mild oral retention, multiple swallows with all boluses.  No overt coughing observed during session.  Pt [name] to self feed with right hand, needing assist for support of utensils/cup and initiation of motor response.  Mod assist overall for safety with POs.  Recommend upgrading diet to dysphagia 2, thin liquids; continue meds whole in puree for now; full supervision for safety.  SLP will follow - please order cognitive/linguistic evaluation given aforementioned deficits.    HPI Other Pertinent Information: 29yo male with no PMH except for shingles in L eye several weeks prior to admission (rx with antiviral) presented 8/8 with 2 week hx headache, weight loss and progressive lethargy and confusion. Found to have several lesions in R temporal lobe with mass effect. Seen in consultation by neurosurgery and taken 8/10 to OR for craniotomy and R temporal lobectomy. NEW dx HIV. Worsening R -> L brain edema and midline shift 02/23/15 - improved clinically with normal mental status 02/24/15 after 3% saline. Extubated 8/14 am.    Pertinent Vitals Pain Assessment: No/denies pain  SLP Plan  Continue with current plan of care    Recommendations Diet recommendations: Dysphagia 2 (fine chop);Thin liquid Liquids provided via: Cup Medication Administration: Whole meds with puree Supervision: Patient [name] to self feed;Staff to assist with self feeding Compensations: Slow rate;Small sips/bites Postural Changes  and/or Swallow Maneuvers: Seated upright 90 degrees;Upright 30-60 min after meal              Oral Care Recommendations: Oral care BID Follow up Recommendations: Inpatient Rehab Plan: Continue with current plan of care   Victor [name] L. Tivis Ringer, [place] CCC/SLP Pager [phone]      Victor [name] 02/26/2015, 12:24 PM

## 2015-02-26 NOTE — Progress Notes (Signed)
Physical Therapy Treatment Patient Details Name: Victor [name] MRN: [id number] DOB: 1985/07/31 Today's Date: 02/26/2015    History of Present Illness 29yo male with no PMH except for shingles in L eye several weeks prior to admission (rx with antiviral) presented 8/8 with 2 week hx headache, weight loss and progressive lethargy and confusion. Found to have several lesions in R temporal lobe with mass effect. Seen in consultation by neurosurgery and taken 8/10 to OR for craniotomy and R temporal lobectomy. NEW dx HIV. Worsening R -> L brain edema and midline shift 02/23/15 - improved clinically with normal mental status 02/24/15 after 3% saline. Extubated 8/14 am.     PT Comments    Pt with limited participation, but more alert as expected in an upright position.  Emphasis on sitting balance, standing balance/tolerance, cervical extension and assisting with arousal.  Follow Up Recommendations  CIR     Equipment Recommendations  Other (comment) (TBD)    Recommendations for Other Services       Precautions / Restrictions Precautions Precautions: Fall    Mobility  Bed Mobility Overal bed mobility: Needs Assistance Bed Mobility: Supine to Sit;Sit to Supine     Supine to sit: Total assist Sit to supine: +2 for safety/equipment;Max assist   General bed mobility comments: not aroused until pt came to sitting.  Transfers Overall transfer level: Needs assistance Equipment used: 2 person hand held assist Transfers: Sit to/from [facility] Sit to Stand: Max assist;Mod assist;+2 physical assistance Stand pivot transfers: Total assist;+2 safety/equipment       General transfer comment: needed assist to come forward, truncal and L> R knee support..  Pt did not pivot L LE to get to the chair.  Ambulation/Gait             General Gait Details: Unable   Stairs            Wheelchair Mobility    Modified Rankin (Stroke Patients Only)        Balance Overall balance assessment: Needs assistance Sitting-balance support: No upper extremity supported Sitting balance-Leahy Scale: Poor Sitting balance - Comments: periods of maintaining slumped posture in midline vs falling posteriorly and to the left.  Needed consistent cues for minimal focus on task.   Standing balance support: Bilateral upper extremity supported Standing balance-Leahy Scale: Zero                      Cognition Arousal/Alertness: Lethargic Behavior During Therapy: Flat affect Overall Cognitive Status: Impaired/Different from baseline Area of Impairment: Orientation;Attention;Following commands;Awareness;Problem solving Orientation Level: Place;Time;Situation Current Attention Level: Focused   Following Commands: Follows one step commands inconsistently   Awareness: Intellectual Problem Solving: Slow processing      Exercises      General Comments General comments (skin integrity, edema, etc.): Pt remained in standing 3 or more minutes during pericare.  VSS      Pertinent Vitals/Pain Pain Assessment: Faces Faces Pain Scale: No hurt    Home Living                      Prior Function            PT Goals (current goals can now be found in the care plan section) Acute Rehab PT Goals PT Goal Formulation: Patient unable to participate in goal setting Time For Goal Achievement: 03/11/15 Potential to Achieve Goals: Good Progress towards PT goals: Progressing toward goals    Frequency  Min  3X/week    PT Plan Current plan remains appropriate    Co-evaluation             End of Session   Activity Tolerance: Patient tolerated treatment well Patient left: in chair;with call bell/phone within reach;with nursing/sitter in room;Other (comment) (on lift pad.)     Time: 5501-5868 PT Time Calculation (min) (ACUTE ONLY): 23 min  Charges:  $Therapeutic Activity: 23-37 mins                    G Codes:      Lynnlee Revels,  [name] 02/26/2015, 4:44 PM 02/26/2015  [name], PT [phone]  (pager)

## 2015-02-26 NOTE — Progress Notes (Signed)
Victor [name]   DOB:May 02, 1986   BS#:[id number]   UTM#:[id number]  Patient Care Team: [name] as PCP - General (General Practice)  I have seen the patient, examined him and edited the notes as follows  Subjective: Events since 8/13 noted. Patient is awake but somewhat sedated, unable to interact at this time. Per nursing report, no new events overnight. He is to be admitted to the Rehab unit soon.   Scheduled Meds: . antiseptic oral rinse  7 mL Mouth Rinse QID  . chlorhexidine  15 mL Mouth Rinse BID  . dexamethasone  6 mg Intravenous 4 times per day  . docusate sodium  100 mg Oral BID  . dolutegravir  50 mg Oral Daily  . emtricitabine-tenofovir  1 tablet Per Tube Daily  . levETIRAcetam  500 mg Intravenous Q12H  . pantoprazole (PROTONIX) IV  40 mg Intravenous QHS  . sulfamethoxazole-trimethoprim  1 tablet Oral Daily   Continuous Infusions: . sodium chloride 50 mL/hr at 02/25/15 1120   PRN Meds:.fentaNYL (SUBLIMAZE) injection, labetalol, naLOXone (NARCAN)  injection, [DISCONTINUED] ondansetron **OR** ondansetron (ZOFRAN) IV, promethazine  Objective:  Filed Vitals:   02/26/15 0800  BP: 124/81  Pulse: 47  Temp:   Resp: 26      Intake/Output Summary (Last 24 hours) at 02/26/15 1024 Last data filed at 02/26/15 0800  Gross per 24 hour  Intake   1255 ml  Output   2025 ml  Net   -770 ml    ECOG PERFORMANCE STATUS:4   GENERAL: patient is sedated. Ill appearing, now extubated SKIN: skin color, texture, turgor are normal, no rashes or significant lesions. Right cranial incisions clean and dry EYES: Noted mild swelling around the right periorbital region. His eyes are closed at this time. No apparent signs of conjunctivitis OROPHARYNX: Unable to examine, patient is sound asleep NECK: supple, thyroid normal size, non-tender, without nodularity LYMPH: no palpable lymphadenopathy in the cervical, axillary or inguinal LUNGS: decreased breath sounds at the bases  anteriorly  with normal breathing effort HEART: regular rate & rhythm and no murmurs and no lower extremity edema ABDOMEN:abdomen soft, non-tender and normal bowel sounds Musculoskeletal: unable to assess. Mittens in hands PSYCH: Patient is awake but not engaging in coversation, responds to voice and touch, but not able to follow commands at this time NEURO: Able to grip my hand with his left arm, significant weakness is noted    CBG (last 3)   Recent Labs  02/24/15 0812 02/24/15 1225 02/24/15 1551  GLUCAP 115* 117* 117*     Labs:   Recent Labs Lab 02/22/15 0515 02/23/15 0230 02/24/15 0510 02/25/15 0615 02/26/15 0600  WBC 8.2 6.8 7.2 7.7 8.1  HGB 9.9* 9.9* 10.0* 9.8* 10.7*  HCT 29.6* 30.2* 30.5* 30.3* 32.4*  PLT 319 321 326 312 332  MCV 82.0 83.7 85.2 85.8 85.7  MCH 27.4 27.4 27.9 27.8 28.3  MCHC 33.4 32.8 32.8 32.3 33.0  RDW 13.6 13.8 13.8 14.3 14.1  LYMPHSABS  --  0.4* 0.3* 0.5* 0.5*  MONOABS  --  0.5 0.4 0.3 0.3  EOSABS  --  0.0 0.0 0.0 0.0  BASOSABS  --  0.0 0.0 0.0 0.0     Chemistries:    Recent Labs Lab 02/22/15 0515 02/23/15 0230  02/24/15 0510  02/24/15 1710 02/24/15 2330 02/25/15 0615 02/25/15 1015 02/26/15 0600  NA 134* 134*  < > 138  < > 142 142 144 145 136  K 4.0 4.1  --  4.4  --   --   --  3.9  --  3.9  CL 101 101  --  106  --   --   --  114*  --  105  CO2 25 26  --  26  --   --   --  25  --  26  GLUCOSE 153* 139*  --  116*  --   --   --  106*  --  108*  BUN 8 12  --  13  --   --   --  12  --  16  CREATININE 0.72 0.69  --  0.65  --   --   --  0.58*  --  0.62  CALCIUM 9.0 8.9  --  8.8*  --   --   --  8.8*  --  9.1  MG 2.1 2.1  --  2.1  --   --   --  2.0  --  1.9  < > = values in this interval not displayed.  CBG:  Recent Labs Lab 02/23/15 2348 02/24/15 0341 02/24/15 0812 02/24/15 1225 02/24/15 1551  GLUCAP 133* 120* 115* 117* 117*   Microbiology Gram Positive in clusters  Patient: Victor [name], Victor [name] Collected: 02/20/2015  Client: [facility] Accession: [id number] Received: 02/20/2015 Karie Chimera INTERPRETATION Interpretation Tissue-Flow Cytometry - B CELL POPULATION IDENTIFIED. - SEE COMMENT. Diagnosis Comment: The analysis generally shows non specific staining likely due to the necrosis present within the tumor. Nonetheless, there is a B-cell population expressing pan B-cell antigens but with nonspecific staining for kappa and lambda light chains that hinder assessment and/or quantitation of clonality. The findings are not considered diagnostic of a neoplastic process. (BNS:gt, 02/22/15) Susanne Greenhouse MD Pathologist, Electronic Signature (Case signed 02/22/2015) GROSS AND MICROSCOPIC INFORMATION Specimen Clinical Information right temporal mass Source Tissue-Flow Cytometry Microscopic Gated population: Flow cytometric immunophenotyping is performed using antibiodies to the antigens listed in the table below. Electronic gates are placed around a cell cluster displaying light scatter properties corresponding to lymphocytes. - Abnormal Cells in gated population: See note - Phenotype of Abnormal Cells: See note  Microscopic Comment LYMPHOMA Histologic type: Non Hodgkin's lymphoma, diffuse large cell type. Grade (if applicable): High grade. Flow cytometry: Noncontributory ([id number]). Immunohistochemical stains: CD10, CD43, CD79a, LCA, CD20, CD30, CD138, BCL-2, BCL-6, CD3 and CD5 with appropriate controls. Touch preps/imprints: Not done. Comments: The sections show a dense infiltrate of large pleomorphic lymphoid appearing cells characterized by prominent nucleoli and generally abundant eosinophilic to amphophilic cytoplasm associated brisk mitoses and prominent necrosis. The appearance is mostly diffuse with lack of follicular structures. To further evaluate this process, flow cytometric analysis was performed but generally was noncontributory. Immunohistochemical stains were performed and  show that the large atypical lymphoid appearing cells are positive for LCA, CD20, CD79a, and BCL-2 in addition to patchy staining for CD43, and and CD30. No significant staining is seen with CD10, or BCL-6. No significant CD5 staining is seen in B cell areas. There is patchy weak non specific staining with CD138 (plasma cell marker) but this is generally negative. There is an admixed minor T cell population as seen with CD3. The histiologic and immunophenotypic features are consistent with diffuse large B cell lymphoma. Clinical correlation is recommended   Imaging Studies:   COMPARISON: February 21, 2015.  FINDINGS: Status post right frontal and temporal craniotomy. Two masses are again noted laterally in the right temporal lobe with surrounding white matter edema. There appears to be a small amount of hemorrhage around these lesions which may  be slightly increased compared to prior exam and consistent with postoperative hemorrhage. 1 cm of right to left midline shift is noted which is increased compared to prior exam. 2.7 x 2.5 cm mass is noted more medially in right cerebral hemisphere with surrounding white matter edema. Ventricular size is within normal limits.  IMPRESSION: Increased right to left midline shift is noted most likely due to worsening white matter edema due to intraparenchymal masses as described above. There appears to be a slightly increased amount of postoperative hemorrhage seen inferiorly in the right temporal lobe.  Assessment/Plan: 29 y.o.   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 He would benefit from PET CT scan as an outpatient Had a long discussion with the patient's mother, explaining the natural history and behavior of diffuse large B cell  lymphoma/CNS lymphoma. The patient is to be on anti-retroviral treatment along with systemic chemotherapy in the future. Case presented hematology tumor Board, recommendation would be to get MR spine screening for drop metastasis, PET scan and Ophthalmology consult I discussed with his parents possibility for transfer to [facility] to get tertiary care due to complexity of his medical illness   Anemia of chronic illness He is not symptomatic, observe only  HIV infection He is followed closely by infectious disease and had been started on anti-retroviral treatment  Postoperative hemorrhage/edema Reduced consciousness/encephalopathy He is currently on hypertonic saline and high-dose steroid-induced therapy along with Keppra for seizure prophylaxis Will need intensive rehab  The rest of his treatment per primary service  Full Code  Other medical issues as per admitting team Spent 45 minutes with family, more than 50% on counselling  Rondel Jumbo, PA-C 02/26/2015  10:24 AM [place], Qusai Kem, MD 02/26/2015

## 2015-02-27 ENCOUNTER — Telehealth: Payer: Self-pay | Admitting: Hematology and Oncology

## 2015-02-27 LAB — CBC
HCT: 32.4 % — ABNORMAL LOW (ref 39.0–52.0)
Hemoglobin: 10.8 g/dL — ABNORMAL LOW (ref 13.0–17.0)
MCH: 27.6 pg (ref 26.0–34.0)
MCHC: 33.3 g/dL (ref 30.0–36.0)
MCV: 82.9 fL (ref 78.0–100.0)
PLATELETS: 335 10*3/uL (ref 150–400)
RBC: 3.91 MIL/uL — AB (ref 4.22–5.81)
RDW: 13.8 % (ref 11.5–15.5)
WBC: 9.9 10*3/uL (ref 4.0–10.5)

## 2015-02-27 LAB — BASIC METABOLIC PANEL
Anion gap: 7 (ref 5–15)
BUN: 16 mg/dL (ref 6–20)
CHLORIDE: 97 mmol/L — AB (ref 101–111)
CO2: 24 mmol/L (ref 22–32)
CREATININE: 0.73 mg/dL (ref 0.61–1.24)
Calcium: 8.2 mg/dL — ABNORMAL LOW (ref 8.9–10.3)
GFR calc Af Amer: >60 mL/min (ref >60)
GLUCOSE: 122 mg/dL — AB (ref 65–99)
POTASSIUM: 4.1 mmol/L (ref 3.5–5.1)
Sodium: 128 mmol/L — ABNORMAL LOW (ref 135–145)

## 2015-02-27 LAB — PHOSPHORUS: Phosphorus: 3.6 mg/dL (ref 2.5–4.6)

## 2015-02-27 LAB — MAGNESIUM: Magnesium: 1.8 mg/dL (ref 1.7–2.4)

## 2015-02-27 MED ORDER — DOCUSATE SODIUM 100 MG PO CAPS: 100.0000 mg | ORAL_CAPSULE | Freq: Two times a day (BID) | ORAL | Status: AC

## 2015-02-27 MED ORDER — LABETALOL HCL 5 MG/ML IV SOLN: 10.0000 mg | INTRAVENOUS | Status: AC | PRN

## 2015-02-27 MED ORDER — PANTOPRAZOLE SODIUM 40 MG IV SOLR: 40.0000 mg | Freq: Every day | INTRAVENOUS | Status: AC

## 2015-02-27 MED ORDER — DEXAMETHASONE SODIUM PHOSPHATE 4 MG/ML IJ SOLN: 6.0000 mg | Freq: Four times a day (QID) | INTRAMUSCULAR | Status: AC

## 2015-02-27 MED ORDER — NALOXONE HCL 0.4 MG/ML IJ SOLN: 0.0800 mg | INTRAMUSCULAR | Status: AC | PRN

## 2015-02-27 MED ORDER — EMTRICITABINE-TENOFOVIR DF 200-300 MG PO TABS: 1.0000 | ORAL_TABLET | Freq: Every day | ORAL | Status: AC

## 2015-02-27 MED ORDER — SODIUM CHLORIDE 0.9 % IV SOLN: 500.0000 mg | Freq: Two times a day (BID) | INTRAVENOUS | Status: AC

## 2015-02-27 MED ORDER — AZITHROMYCIN 600 MG PO TABS: 1200.0000 mg | ORAL_TABLET | ORAL | Status: AC

## 2015-02-27 MED ORDER — DOLUTEGRAVIR SODIUM 50 MG PO TABS: 50.0000 mg | ORAL_TABLET | Freq: Every day | ORAL | Status: AC

## 2015-02-27 MED ORDER — FLUCONAZOLE 100 MG PO TABS: 100.0000 mg | ORAL_TABLET | ORAL | Status: AC

## 2015-02-27 MED ORDER — MAGNESIUM HYDROXIDE 400 MG/5ML PO SUSP: 30.0000 mL | Freq: Once | ORAL | Status: AC

## 2015-02-27 MED ORDER — SODIUM CHLORIDE 0.9 % IV SOLN: 50.0000 mL | INTRAVENOUS | Status: AC

## 2015-02-27 MED ORDER — SULFAMETHOXAZOLE-TRIMETHOPRIM 800-160 MG PO TABS: 1.0000 | ORAL_TABLET | Freq: Every day | ORAL | Status: AC

## 2015-02-27 MED ORDER — PROMETHAZINE HCL 12.5 MG PO TABS: 12.5000 mg | ORAL_TABLET | ORAL | Status: AC | PRN

## 2015-02-27 MED ORDER — FENTANYL CITRATE (PF) 100 MCG/2ML IJ SOLN: 12.5000 ug | INTRAMUSCULAR | Status: AC | PRN

## 2015-02-27 MED ORDER — ENSURE ENLIVE PO LIQD
237.0000 mL | Freq: Three times a day (TID) | ORAL | Status: DC
  Administered 2015-02-27: 237 mL via ORAL

## 2015-02-27 MED ORDER — BISACODYL 10 MG RE SUPP
10.0000 mg | Freq: Once | RECTAL | Status: AC
  Administered 2015-02-27: 10 mg via RECTAL
  Filled 2015-02-27: qty 1

## 2015-02-27 MED ORDER — ENSURE ENLIVE PO LIQD: 237.0000 mL | Freq: Three times a day (TID) | ORAL | Status: AC

## 2015-02-27 MED ORDER — ONDANSETRON HCL 4 MG/2ML IJ SOLN: 4.0000 mg | INTRAMUSCULAR | Status: AC | PRN

## 2015-02-27 NOTE — Discharge Summary (Signed)
Physician Discharge Summary  Patient ID: Victor [name] MRN: [id number] DOB/AGE: June 19, 1986 29 y.o.  Admit date: 02/18/2015 Discharge date: 02/27/2015    Discharge Diagnoses:  Acute respiratory failure following surgery - resolved. At risk post op atelectasis. At risk aspiration. Brain lesion, R temporal lobe - s/p crani/lobectomy 02/20/15. Confirmed B Cell lymphoma. Worsening R to L brain edema and midline shift 8/13/168/14/16 after 3% saline. ? Opthalmologic herpes. HTN.                                                                       DISCHARGE PLAN BY DIAGNOSIS    Acute respiratory failure following surgery - resolved. At risk post op atelectasis. At risk aspiration. Plan: Pulmonary hygiene. Incentive spirometry. Aspiration precautions.  Brain lesion, R temporal lobe - s/p crani/lobectomy 02/20/15. Confirmed B Cell lymphoma. Worsening R to L brain edema and midline shift 8/13/168/14/16 after 3% saline. ? Opthalmologic herpes. P:  Continue keppra, decadron. Transfer to [facility] for further management. Continue HART, Bactrim, Azithromycin, Diflucan. PRN fentanyl for pain. Per oncology notes, pt will require MRI spine and PET, and they are recommending opthalmology consult.  HTN . Plan: PRN labetalol for SBP > 160.                DISCHARGE SUMMARY   Victor [name] is a 29 y.o. y/o [name] with no significant prior PMH with the exception of recently diagnosed shingles to the left eye.  He presented to [facility] 8/8 with 2 week hx of headache, weight loss, progressive lethargy, and confusion.  CT scan in ED revealed a mass in the right basal ganglia just lateral to the Caltrate measuring about 3.5 cm with significant vasogenic edema of the R temporal and frontal lobes with midline shift.  He was seen in consultation by neurosurgery who took him to the OR for for craniotomy and R temporal lobectomy.  Postoperatively, he was treated with decadron and keppra.    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 ID was consulted and pt was started on ART with Tivicay and Truvada as well as prophylaxis with daily bactrim, weekly fluconazole, and weekly azithromycin. 8/13 surgical pathology confirmed diffuse B Cell large lymphoma.   Oncology was consulted and due to the complexity of his case, it was recommended that he be transferred to [facility] for tertiary care. On 8/14, pt was successfully extubated.  He passed speech swallow eval on 8/15 with dysphagia 1 diet; this was upgraded to dysphagia 2 with thin liquids on 8/16. On 8/17, he was deemed medically stable and cleared for discharge to [facility].  For a summary of events and studies, please see below.            SIGNIFICANT DIAGNOSTIC STUDIES CT head 8/8 >>> mass in R basal ganglia / lateral to caudate nucleus, measures at least 3.5cm.  Vasogenic edema in R frontal and R temporal lobe.  Mass effect on R lateral ventricle.  No hemorrhage. MRI brain 8/8 >>> masslike lesions with enhancement and edema R > L.  Intracranial mass effect with leftward MLS of 58m and ventricular effacement. CT head 8/11 >>> interval R frontal temporal craniotomy.  Residual 15 x 190mR temporal lobe nodule.  Extensive surround edema with 66m R to L mass effect.  Abnormal dense basal ganglia with superimposed nodular enhancement R basal ganglia c/w primary CNS lymphoma. CT head 8/13 >>> increased R to L MLS.  Increased amount of post operative hemorrhage.   SIGNIFICANT EVENTS 8/8 - admit 8/10 - to OR for craniotomy and R temporal lobectomy 8/13 - CNS pathology confirmed as diffuse B cell large lymphoma.  ID concerned for HIV encephalitis.  Oncology consulted 8/14 - extubated 8/15 - passed swallow eval 8/17 - cleared for discharge  MICRO DATA  Blood 8/8 > neg  ANTIBIOTICS Azithromycin 8/17 >>> Tivicay 8/11 >>> Truvada 8/11 >>> Bactrim 8/11 >>> Diflucan 8/16 >>> Ceftriaxone 8/8 >>>  8/10 Vancomycin 8/8 >>> 8/8 Acyclovir 8/8 >>> 8/10  CONSULTS Neurosurgery ID Oncology  TUBES / LINES ETT 8/10 >>> 8/14   Discharge Exam: General: Victor [name], resting in bed, in NAD. Neuro: Somnolent, difficult to arouse (per RN, pt has been this way for a few days now and he apparently awakes easily when his mom is at bedside). HEENT: R temporal lobe incision noted with staples in place. PERRL, sclerae anicteric. Cardiovascular: RRR, no M/R/G.  Lungs: Respirations shallow and unlabored.  CTA bilaterally, No W/R/R. Abdomen: BS x 4, soft, NT/ND.  Musculoskeletal: No gross deformities, no edema.  Skin: Intact, warm, no rashes.   Filed Vitals:   02/27/15 1700 02/27/15 1800 02/27/15 1900 02/27/15 2000  BP:  120/78 121/74 118/82  Pulse: 79 59 72 73  Temp:    98.4 F (36.9 C)  TempSrc:    Oral  Resp: 24 22 26 22   Height:      Weight:      SpO2: 97% 99% 97% 97%     Discharge Labs  BMET  Recent Labs Lab 02/23/15 0230  02/24/15 0510  02/24/15 2330 02/25/15 0615 02/25/15 1015 02/26/15 0600 02/27/15 0524  NA 134*  < > 138  < > 142 144 145 136 128*  K 4.1  --  4.4  --   --  3.9  --  3.9 4.1  CL 101  --  106  --   --  114*  --  105 97*  CO2 26  --  26  --   --  25  --  26 24  GLUCOSE 139*  --  116*  --   --  106*  --  108* 122*  BUN 12  --  13  --   --  12  --  16 16  CREATININE 0.69  --  0.65  --   --  0.58*  --  0.62 0.73  CALCIUM 8.9  --  8.8*  --   --  8.8*  --  9.1 8.2*  MG 2.1  --  2.1  --   --  2.0  --  1.9 1.8  PHOS 3.0  --  3.3  --   --  4.2  --  4.2 3.6  < > = values in this interval not displayed.  CBC  Recent Labs Lab 02/25/15 0615 02/26/15 0600 02/27/15 0524  HGB 9.8* 10.7* 10.8*  HCT 30.3* 32.4* 32.4*  WBC 7.7 8.1 9.9  PLT 312 332 335    Anti-Coagulation No results for input(s): INR in the last 168 hours.          Medication List    STOP taking these medications        cyclopentolate 1 % ophthalmic solution  Commonly known  as:  CYCLODRYL,CYCLOGYL     erythromycin ophthalmic ointment     oxyCODONE-acetaminophen 5-325 MG per tablet  Commonly known as:  PERCOCET     prednisoLONE acetate 1 % ophthalmic suspension  Commonly known as:  PRED FORTE     valACYclovir 1000 MG tablet  Commonly known as:  VALTREX      TAKE these medications        azithromycin 600 MG tablet  Commonly known as:  ZITHROMAX  Take 2 tablets (1,200 mg total) by mouth once a week.     dexamethasone 4 MG/ML injection  Commonly known as:  DECADRON  Inject 1.5 mLs (6 mg total) into the vein every 6 (six) hours.     docusate sodium 100 MG capsule  Commonly known as:  COLACE  Take 1 capsule (100 mg total) by mouth 2 (two) times daily.     dolutegravir 50 MG tablet  Commonly known as:  TIVICAY  Take 1 tablet (50 mg total) by mouth daily.     emtricitabine-tenofovir 200-300 MG per tablet  Commonly known as:  TRUVADA  Place 1 tablet into feeding tube daily.     feeding supplement (ENSURE ENLIVE) Liqd  Take 237 mLs by mouth 3 (three) times daily between meals.     fentaNYL 100 MCG/2ML injection  Commonly known as:  SUBLIMAZE  Inject 0.25-1 mLs (12.5-50 mcg total) into the vein every 2 (two) hours as needed (to maintain RASS goal.).     fluconazole 100 MG tablet  Commonly known as:  DIFLUCAN  Take 1 tablet (100 mg total) by mouth once a week.     labetalol 5 MG/ML injection  Commonly known as:  NORMODYNE,TRANDATE  Inject 2-8 mLs (10-40 mg total) into the vein every 10 (ten) minutes as needed.     levETIRAcetam 500 mg in sodium chloride 0.9 % 100 mL  Inject 500 mg into the vein every 12 (twelve) hours.     magnesium hydroxide 400 MG/5ML suspension  Commonly known as:  MILK OF MAGNESIA  Take 30 mLs by mouth once.     naloxone 0.4 MG/ML injection  Commonly known as:  NARCAN  Inject 0.2 mLs (0.08 mg total) into the vein as needed (to reverse respiratory depression.  May give every 3 minutes prn. Not to exceed 0.4 mg total  dose.).     ondansetron 4 MG/2ML Soln injection  Commonly known as:  ZOFRAN  Inject 2 mLs (4 mg total) into the vein every 4 (four) hours as needed for nausea or vomiting.     pantoprazole 40 MG injection  Commonly known as:  PROTONIX  Inject 40 mg into the vein at bedtime.     promethazine 12.5 MG tablet  Commonly known as:  PHENERGAN  Take 1-2 tablets (12.5-25 mg total) by mouth every 4 (four) hours as needed for refractory nausea / vomiting.     sodium chloride 0.9 % infusion  Inject 50 mLs into the vein continuous.     sulfamethoxazole-trimethoprim 800-160 MG per tablet  Commonly known as:  BACTRIM DS,SEPTRA DS  Take 1 tablet by mouth daily.         Disposition: [facility].  Discharged Condition: Victor [name] has met maximum benefit of inpatient care and is medically stable and cleared for discharge.  Patient is pending follow up as above.      Time spent on disposition:  Greater than 45 minutes.   Montey Hora, PA - [facility] [place] [facility] Pgr: [phone]  or [phone]   Attending Note:  I have examined patient, reviewed labs, studies and notes. I have discussed the case with Victor [name], and I agree with the data and plans as amended above.   Baltazar Apo, MD, PhD 03/01/2015, 3:55 PM [place] [facility] [phone] or if no answer [phone]

## 2015-02-27 NOTE — Progress Notes (Signed)
[place] Progress Note Patient Name: Victor [name] [facility] DOB: 11/02/1985 MRN: [id number]   Date of Service  02/27/2015  HPI/Events of Note  constipation  eICU Interventions  Dulcolax suppository     Intervention Category Minor Interventions: Routine modifications to care plan (e.g. PRN medications for pain, fever)  MCQUAID, DOUGLAS 02/27/2015, 5:34 AM

## 2015-02-27 NOTE — Progress Notes (Signed)
Physical Therapy Treatment Patient Details Name: Victor [name] MRN: [id number] DOB: 1986-04-26 Today's Date: 02/27/2015    History of Present Illness 29yo male with no PMH except for shingles in L eye several weeks prior to admission (rx with antiviral) presented 8/8 with 2 week hx headache, weight loss and progressive lethargy and confusion. Found to have several lesions in R temporal lobe with mass effect. Seen in consultation by neurosurgery and taken 8/10 to OR for craniotomy and R temporal lobectomy. NEW dx HIV. Worsening R -> L brain edema and midline shift 02/23/15 - improved clinically with normal mental status 02/24/15 after 3% saline. Extubated 8/14 am.     PT Comments    Progressing steadily.  Eyes open 100% today.  Nystagmus continues with pursuits.  Emphasis on moving to EOB, sitting balance and standing balance/tolerance.  Follow Up Recommendations  CIR     Equipment Recommendations  Other (comment) (TBA)    Recommendations for Other Services       Precautions / Restrictions Precautions Precautions: Fall    Mobility  Bed Mobility Overal bed mobility: Needs Assistance;+ 2 for safety/equipment Bed Mobility: Supine to Sit;Sit to Supine     Supine to sit: Total assist Sit to supine: Total assist;+2 for physical assistance   General bed mobility comments: pt initiated some movement to cue, but needed significant assist to follow through  Transfers Overall transfer level: Needs assistance Equipment used: 1 person hand held assist Transfers: Sit to/from Stand;Stand Pivot Transfers Sit to Stand: Max assist;+2 safety/equipment Stand pivot transfers: Max assist;+2 physical assistance       General transfer comment: Pt bearing minimal weight on L LE.  Needed to physically move L LE and give w/shift support for pt to step with R LE  Ambulation/Gait                 Stairs            Wheelchair Mobility    Modified Rankin (Stroke Patients  Only)       Balance Overall balance assessment: Needs assistance   Sitting balance-Leahy Scale: Poor Sitting balance - Comments: periods of maintaining slumped posture in midline vs falling posteriorly and to the left.  Needed consistent cues for minimal focus on task.   Standing balance support: Single extremity supported;Bilateral upper extremity supported Standing balance-Leahy Scale: Poor Standing balance comment: stood x 5 minutes working on w/shift, L knee control, balance; began pregait.                    Cognition Arousal/Alertness: Awake/alert;Lethargic Behavior During Therapy: Flat affect (1 good smile) Overall Cognitive Status: Impaired/Different from baseline Area of Impairment: Orientation;Attention;Following commands Orientation Level: Place;Time;Situation Current Attention Level: Focused   Following Commands: Follows one step commands inconsistently   Awareness: Intellectual Problem Solving: Slow processing      Exercises      General Comments General comments (skin integrity, edema, etc.): rising HR into 110's with exertion o/w VSS      Pertinent Vitals/Pain Pain Assessment: Faces Pain Score: 2  Faces Pain Scale: No hurt    Home Living                      Prior Function            PT Goals (current goals can now be found in the care plan section) Acute Rehab PT Goals Patient Stated Goal: unable to state. PT Goal Formulation: Patient unable to participate  in goal setting Time For Goal Achievement: 03/11/15 Potential to Achieve Goals: Good Progress towards PT goals: Progressing toward goals    Frequency  Min 3X/week    PT Plan Current plan remains appropriate    Co-evaluation             End of Session   Activity Tolerance: Patient tolerated treatment well Patient left: in bed;with call bell/phone within reach;with family/visitor present     Time: 0109-3235 PT Time Calculation (min) (ACUTE ONLY): 17  min  Charges:  $Therapeutic Activity: 8-22 mins                    G Codes:      Zyheir Daft, [name] 02/27/2015, 5:21 PM 02/27/2015  [name], PT [phone]  (pager)

## 2015-02-27 NOTE — Progress Notes (Signed)
Patient ID: Victor [name], male   DOB: 1985/10/25, 29 y.o.   MRN: [id number] Afeb, vss No new neuro issues Still slow to respond to me when examined. He is apparently going to go to [facility] for further care???? His wound looks good. Further treatment per oncology and ID.

## 2015-02-27 NOTE — Telephone Encounter (Signed)
Per discussion with the patient's mother, she would like me to call [facility] to see if they would accept the patient for further care. I managed to get hold of the hematologist on call and subsequently spoke with Dr. Burnis Kingfisher and he has agreed to accept the patient as a direct transfer to hematology/oncology C service at [place]. I spoke with our pathologist to release the slides to Dr. Burnis Kingfisher and he will arrange for the slides to be reviewed directly to [facility]. I have also spoken with the primary service who will arrange for discharge summary and copies of imaging studies to be made into a CD for further review.

## 2015-02-27 NOTE — Progress Notes (Signed)
PULMONARY / CRITICAL CARE MEDICINE   Name: Victor [name] MRN: [id number] DOB: 22-Nov-1985    ADMISSION DATE:  02/18/2015 CONSULTATION DATE:  8/10  REFERRING MD :  Kritzer  CHIEF COMPLAINT:  Vent management   INITIAL PRESENTATION:  29yo male with no PMH except for shingles in L eye several weeks prior to admission (rx with antiviral) presented 8/8 with 2 week hx headache, weight loss and progressive lethargy and confusion.  Found to have several lesions in R temporal lobe with mass effect.  Seen in consultation by neurosurgery and taken 8/10 to OR for craniotomy and R temporal lobectomy.  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 PCCM consulted post op for vent management.   STUDIES:  MR brain 8/8>>> Markedly abnormal brain with multifocal mostly intermediate to low T2 signal masslike lesions with enhancement and edema. Right greater than left hemisphere involvement, also with nonenhancing signal abnormality tracking along the periaqueductal gray matter and fourth ventricle.   Intracranial mass effect with leftward midline shift of 8 mm and ventricular effacement. Basilar cisterns remain patent. No ventriculomegaly. Head CT 8/11 >>  Interval RIGHT frontal temporal craniotomy for debulking of RIGHT temporal lobe mass with residual 12 x 16 mm component along the superior margin of the resection cavity. In addition, residual 15 x 17 mm RIGHT temporal lobe nodule. Extensive surrounding edema, with similar 7 mm RIGHT to LEFT mass effect. Abnormally dense basal ganglia with superimposed nodular enhancement RIGHT basal ganglia, findings compatible with primary CNS lymphoma.  SIGNIFICANT EVENTS: 8/10>> OR for crani, R temporal lobectomy 02/21/15: Pathology pending on brain mass, initial stains consistent w neoplasm Appreciate ID consult  02/22/15: Per RN - moves Rt side  well but LUE/LLE - weak purposefully but not awake enough and will not follow commands. Gag + , Cough +. Path still pending. Not on pressors.  No fever. Not on sedation gtt. Not needed prn sedation  02/23/2015 :Tube feed holiday needed forHIV meds. 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 OFf sedatio - RASS -3 and moves toes to command. On decadron. Similar to 24h ago. GAG +, Cough +   SUBJECTIVE/OVERNIGHT/INTERVAL HX No events overnight, extubated.   VITAL SIGNS: Temp:  [97 F (36.1 C)-98.7 F (37.1 C)] 97 F (36.1 C) (08/17 1201) Pulse Rate:  [51-85] 66 (08/17 1100) Resp:  [14-31] 27 (08/17 1100) BP: (113-156)/(70-101) 121/80 mmHg (08/17 1100) SpO2:  [95 %-100 %] 99 % (08/17 1100) Weight:  [67.9 kg (149 lb 11.1 oz)] 67.9 kg (149 lb 11.1 oz) (08/17 0500) HEMODYNAMICS:   VENTILATOR SETTINGS:   INTAKE / OUTPUT:  Intake/Output Summary (Last 24 hours) at 02/27/15 1329 Last data filed at 02/27/15 1132  Gross per 24 hour  Intake   1250 ml  Output   3200 ml  Net  -1950 ml   PHYSICAL EXAMINATION: General:  Thin young male, NAD  Neuro:  gag +, cough +. Moves all 4s to command to command, able to swallow. HEENT:  Mm moist, ETT Cardiovascular:  s1s2 rrr Lungs:  resps even non labored on vent, coarse  Abdomen:  Soft, +bs  Musculoskeletal:  Warm and dry, no edema  LABS:  PULMONARY No results for input(s): PHART, PCO2ART, PO2ART, HCO3, TCO2, O2SAT in the last 168 hours.  Invalid input(s): PCO2, PO2  CBC  Recent Labs Lab 02/25/15 0615 02/26/15 0600 02/27/15 0524  HGB 9.8* 10.7* 10.8*  HCT 30.3* 32.4* 32.4*  WBC 7.7 8.1 9.9  PLT 312 332  335   COAGULATION No results for input(s): INR in the last 168 hours. CARDIAC  No results for input(s): TROPONINI in the last 168 hours. No results for input(s): PROBNP in the last 168 hours.  CHEMISTRY  Recent Labs Lab 02/23/15 0230  02/24/15 0510  02/24/15 2330 02/25/15 0615 02/25/15 1015 02/26/15 0600 02/27/15 0524  NA 134*  < > 138  < > 142 144 145 136 128*  K 4.1  --  4.4  --   --  3.9  --  3.9 4.1  CL  101  --  106  --   --  114*  --  105 97*  CO2 26  --  26  --   --  25  --  26 24  GLUCOSE 139*  --  116*  --   --  106*  --  108* 122*  BUN 12  --  13  --   --  12  --  16 16  CREATININE 0.69  --  0.65  --   --  0.58*  --  0.62 0.73  CALCIUM 8.9  --  8.8*  --   --  8.8*  --  9.1 8.2*  MG 2.1  --  2.1  --   --  2.0  --  1.9 1.8  PHOS 3.0  --  3.3  --   --  4.2  --  4.2 3.6  < > = values in this interval not displayed. Estimated Creatinine Clearance: 130.8 mL/min (by C-G formula based on Cr of 0.73).  LIVER No results for input(s): AST, ALT, ALKPHOS, BILITOT, PROT, ALBUMIN, INR in the last 168 hours. INFECTIOUS No results for input(s): LATICACIDVEN, PROCALCITON in the last 168 hours. ENDOCRINE CBG (last 3)   Recent Labs  02/24/15 1551  GLUCAP 117*   IMAGING I reviewed CXR myself, no acute abnormalities.  ASSESSMENT / PLAN:  PULMONARY OETT 8/10>>>8/14 Acute respiratory failure - post op neurosurgery  P:   Titrate O2 for sat of 88-92%. Monitor closely for airway protection. Speech pathology following, appreciate input.  NEUROLOGIC Admit - Acute encephalopathy  - Brain lesion - R temporal lobe - s/p crani/lobectomy.02/20/15  . Confirmed B Cell lymphoma Post op - Worsening R -> L brain edema and midline shift 02/23/15 - improved clinically with normal mental status 02/24/15 after 3% saline P:   Discussed with NS, d/c 3% saline 8/15. Continue keppra, decadron. Oncology following. Awaiting transfer to [facility] for treatment of lymphoma, H/O spearheading transfer.  CARDIOVASCULAR HTN  P:  Monitor   RENAL Off 3% saline. P:   BMET in AM. Replace electrolytes as indicated. KVO IVF.  GASTROINTESTINAL No active issue  P:   PPI  Diet per speech pathology.  HEMATOLOGIC / ONCOLOGICAL  CNS lymphomoa P:  SCD's. F/u CBC. Onc consult appreciated.  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 Prophylaxis and ART as above. Will also need weekly azithromycin  ENDOCRINE No active issue  P:   Monitor glucose on chem   Discussed with H/O MD, awaiting transfer.  FAMILY  - Updates:  Updated parents  bedside, transfer to SDU and to [facility] service with PCCM off 8/16.  - Inter-disciplinary family meet or Palliative Care meeting due by:  8/17  Rush Farmer, M.D. [facility]. Pager: [phone]. After hours pager: [phone].  02/27/2015 1:29 PM

## 2015-02-27 NOTE — Progress Notes (Signed)
Nutrition Follow-up  DOCUMENTATION CODES:   Severe malnutrition in context of chronic illness  INTERVENTION:   Ensure Enlive po TID, each supplement provides 350 kcal and 20 grams of protein   NUTRITION DIAGNOSIS:   Malnutrition related to chronic illness as evidenced by severe depletion of body fat, severe depletion of muscle mass.  Ongoing   GOAL:   Patient will meet greater than or equal to 90% of their needs  Not met.   MONITOR:   TF tolerance, Diet advancement, I & O's, Labs, Weight trends  ASSESSMENT:   29yo male with no PMH except for shingles in L eye several weeks prior to admission (rx with antiviral) presented 8/8 with 2 week hx headache, weight loss and progressive lethargy and confusion. Found to have several lesions in R temporal lobe with mass effect. Seen in consultation by neurosurgery and taken 8/10 to OR for craniotomy and R temporal lobectomy. 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  Brain lesion - R temporal lobe - s/p crani/lobectomy.02/20/15 . Confirmed B Cell lymphoma Pt discussed during ICU rounds and with RN. Plan for transfer to [facility] for further oncology treatment.   Pt extubated 8/14 and started on PO diet 8/15, pt now advanced to Dysphagia 2 with Thin Liquids Per RN when pt's mom is here he eats really well (> 75% of his meals). When pt's mom is not around he is almost unarrousable and does not eat. Lunch untouched in room.  Medications reviewed and include: colace, decadron, Milk of magnesia Labs reviewed: sodium low at 128  Diet Order:  DIET DYS 2 Room service appropriate?: Yes; Fluid consistency:: Thin  Skin:  Reviewed, no issues  Last BM:  unknown  Height:   Ht Readings from Last 1 Encounters:  02/18/15 6' 3" (1.905 m)    Weight:   Wt Readings from Last 1 Encounters:  02/27/15 149 lb 11.1 oz (67.9 kg)    Ideal Body Weight:  89 kg  BMI:  Body mass index is 18.71 kg/(m^2).  Estimated Nutritional Needs:   Kcal:   2100-2400  Protein:  110-125 gm  Fluid:  2.1-2.4 L  EDUCATION NEEDS:   No education needs identified at this time  [place], [place], [place] Pager [phone] After Hours Pager

## 2015-02-27 NOTE — Progress Notes (Signed)
Speech Language Pathology Treatment: Dysphagia  Patient Details Name: Victor [name] MRN: [id number] DOB: 1986-06-11 Today's Date: 02/27/2015 Time: 4098-1191 SLP Time Calculation (min) (ACUTE ONLY): 18 min  Assessment / Plan / Recommendation Clinical Impression  Pt with marginally improved response time, range of communicative responses today.  Mother present and assisting him with breakfast.  Continues with mild deficits in mastication, prolonged oral preparation. Successive thin liquid boluses elicited a cough, but this was eliminated with smaller, more controlled boluses.  Min cues overall for self feeding and precautions.  Recommend continued SLP; pt would benefit from cognitive evaluation.     HPI Other Pertinent Information: 29yo male with no PMH except for shingles in L eye several weeks prior to admission (rx with antiviral) presented 8/8 with 2 week hx headache, weight loss and progressive lethargy and confusion. Found to have several lesions in R temporal lobe with mass effect. Seen in consultation by neurosurgery and taken 8/10 to OR for craniotomy and R temporal lobectomy. NEW dx HIV. Worsening R -> L brain edema and midline shift 02/23/15 - improved clinically with normal mental status 02/24/15 after 3% saline. Extubated 8/14 am.    Pertinent Vitals    SLP Plan  Continue with current plan of care    Recommendations Diet recommendations: Dysphagia 2 (fine chop);Thin liquid Liquids provided via: Cup Medication Administration: Whole meds with puree Supervision: Patient able to self feed;Staff to assist with self feeding Compensations: Slow rate;Small sips/bites Postural Changes and/or Swallow Maneuvers: Seated upright 90 degrees;Upright 30-60 min after meal              Plan: Continue with current plan of care   Josiah Wojtaszek L. Tivis Ringer, [place] CCC/SLP Pager [phone]      Victor [name] 02/27/2015, 10:51 AM

## 2015-02-28 LAB — HIV-1 INTEGRASE GENOTYPE

## 2015-03-07 LAB — HIV-1 RNA ULTRAQUANT REFLEX TO GENTYP+
HIV-1 RNA BY PCR: 124000 copies/mL
HIV-1 RNA Quant, Log: 5.093 log10copy/mL

## 2015-03-07 LAB — REFLEX TO GENOSURE(R) MG: HIV GenoSure(R) MG PDF: 0

## 2015-04-13 DEATH — deceased

## 2017-06-10 IMAGING — CT CT HEAD W/O CM
1 of 3 series · 15 of 30 positions shown, 19 images · non-contrast
Comparison: February 21, 2015.

CLINICAL DATA: Coma, AIDS with primary lymphoma of brain.

EXAM:
CT HEAD WITHOUT CONTRAST
TECHNIQUE: Contiguous axial images were obtained from the base of the skull
through the vertex without intravenous contrast.

[Series 2: head 2.0 h70h · axial · 0.49mm/px · z∈[-89,+73]mm · 15 of 93 slices shown, 19 images]
[im 6/93  brain]
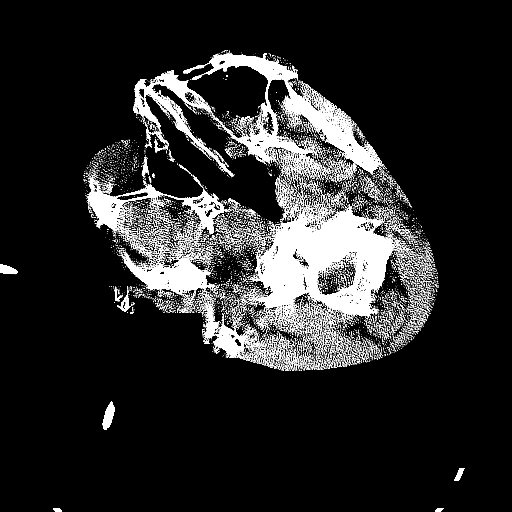
[im 6/93  bone]
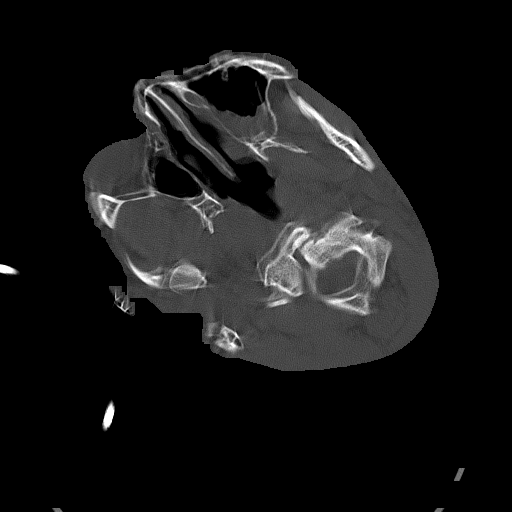
[im 11/93  brain]
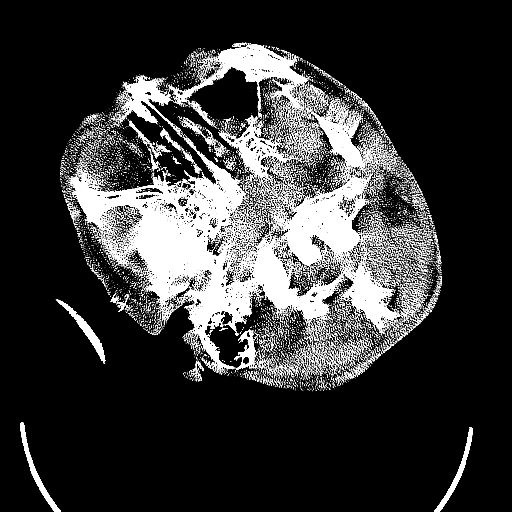
[im 16/93  brain]
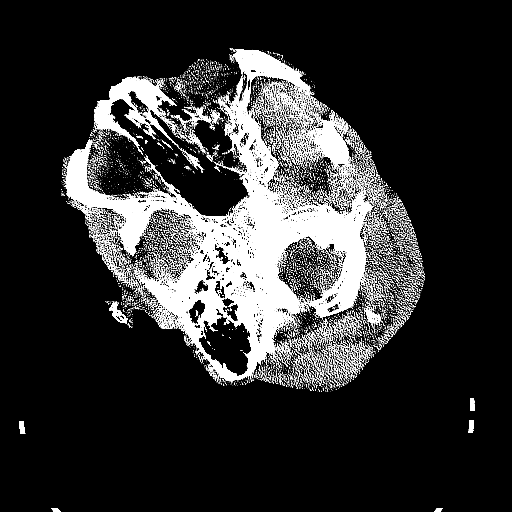
[im 21/93  brain]
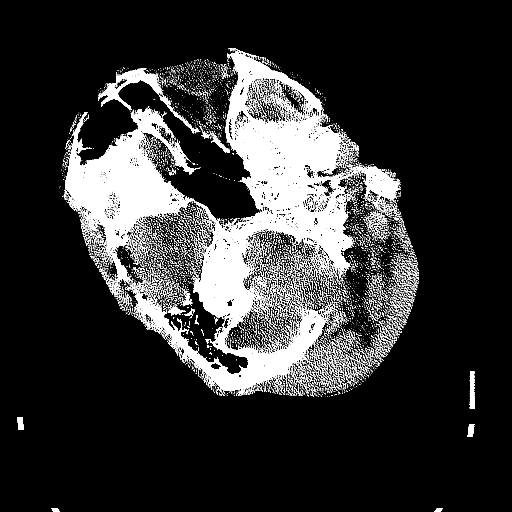
[im 31/93  brain]
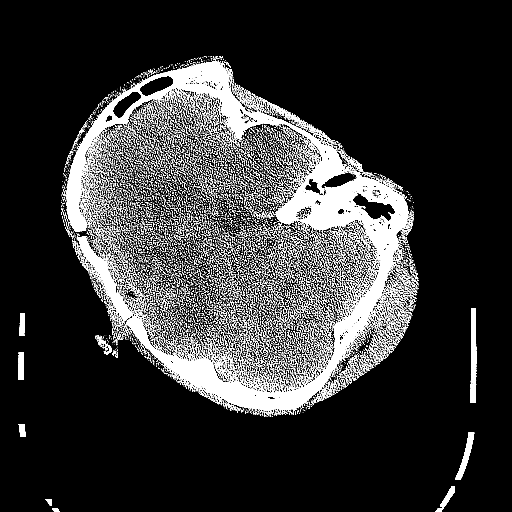
[im 31/93  bone]
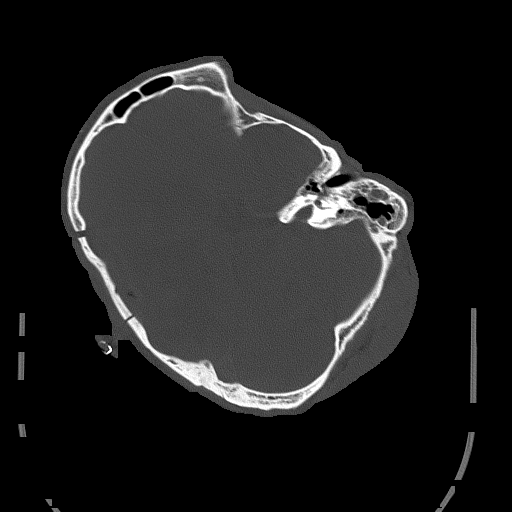
[im 36/93  brain]
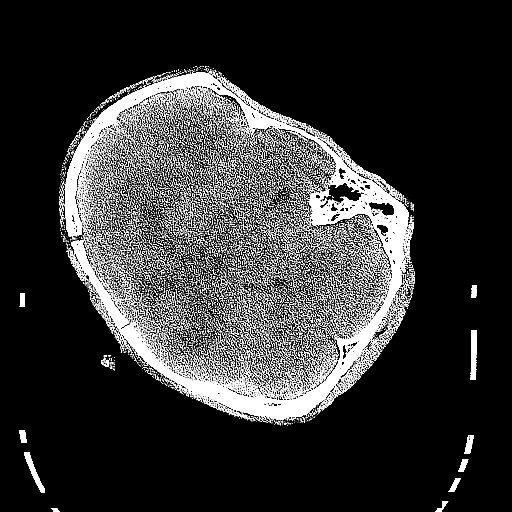
[im 41/93  brain]
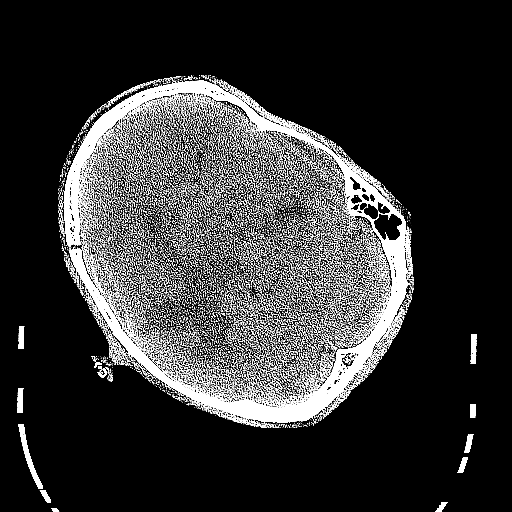
[im 47/93  brain]
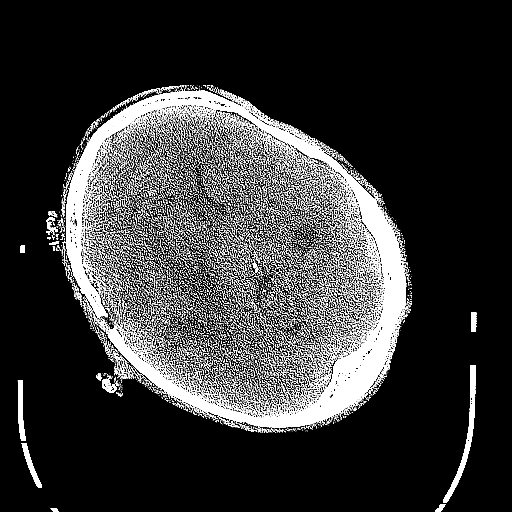
[im 52/93  brain]
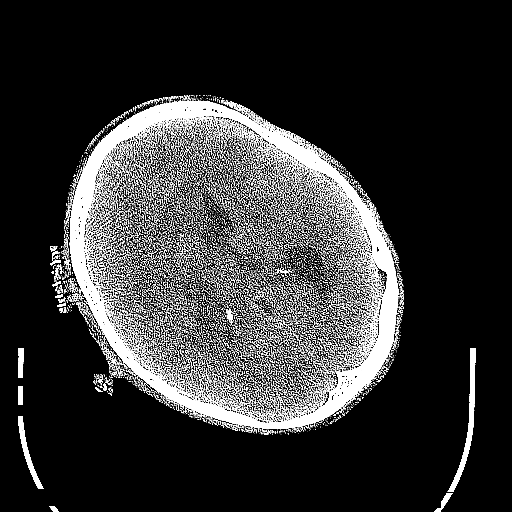
[im 52/93  bone]
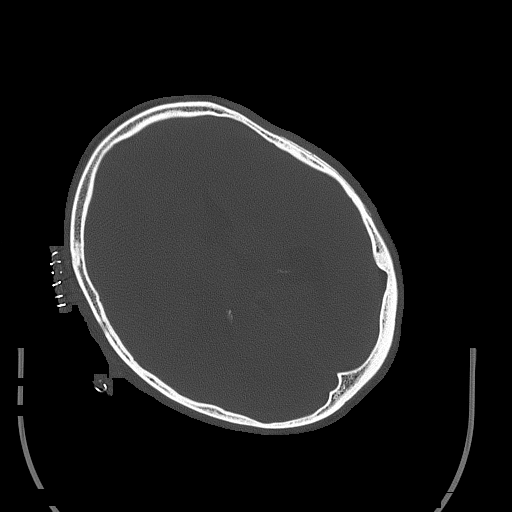
[im 57/93  brain]
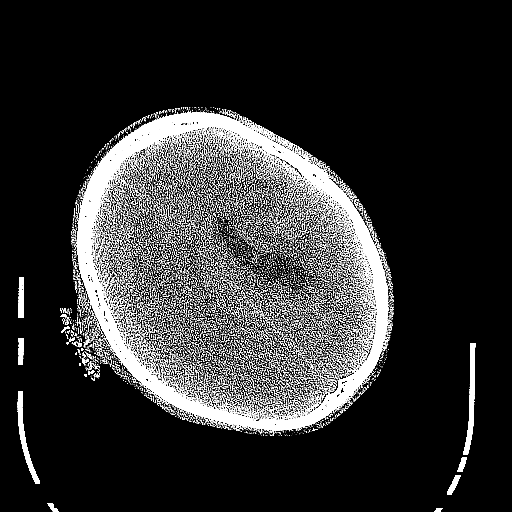
[im 62/93  brain]
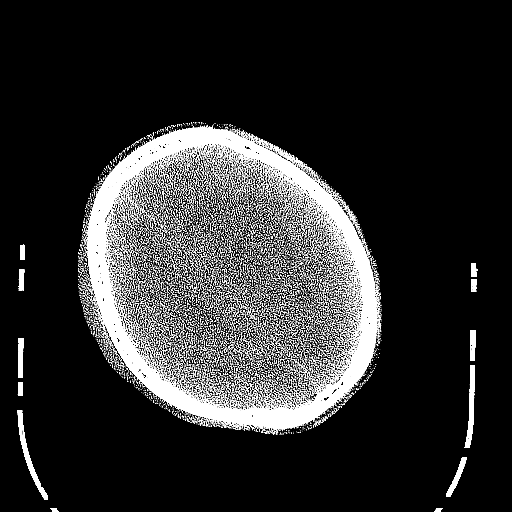
[im 72/93  brain]
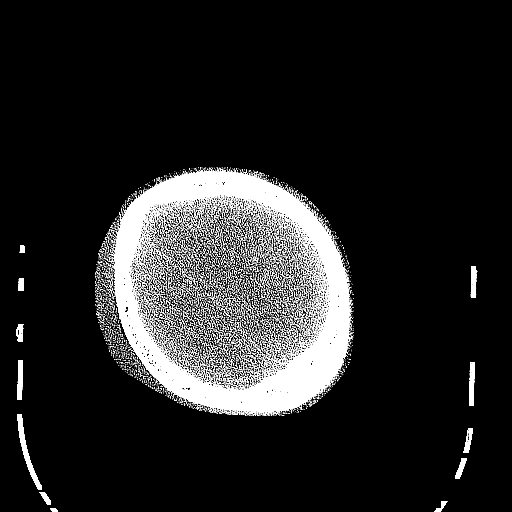
[im 77/93  brain]
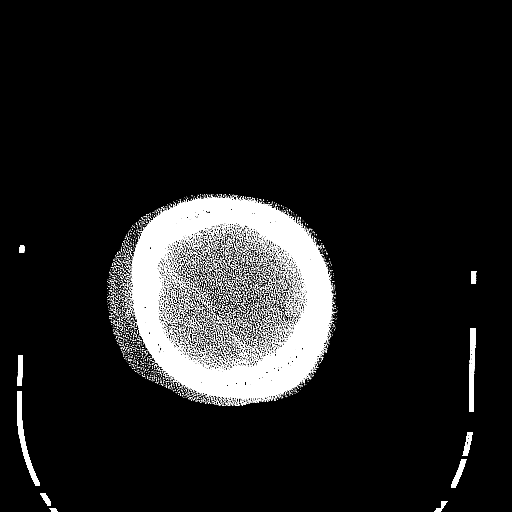
[im 77/93  bone]
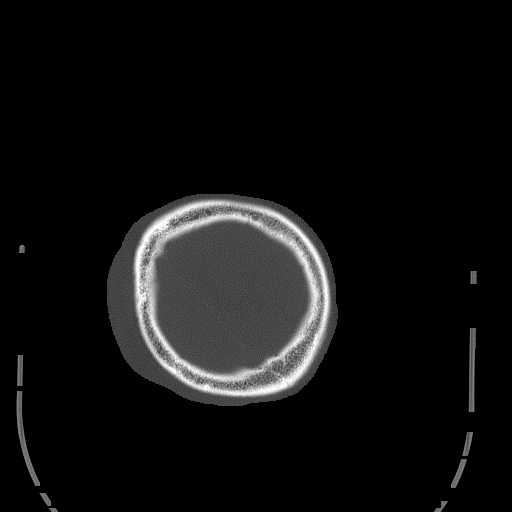
[im 82/93  brain]
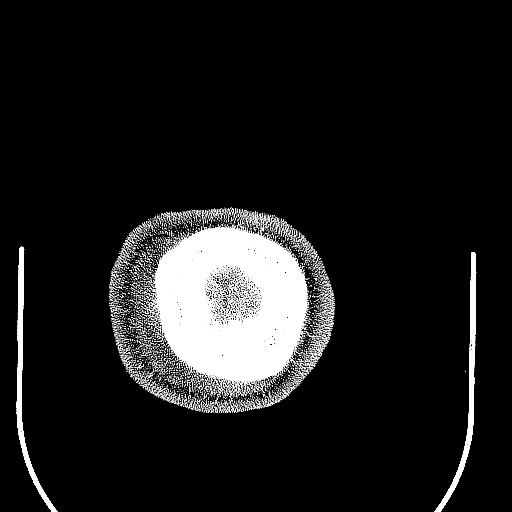
[im 87/93  brain]
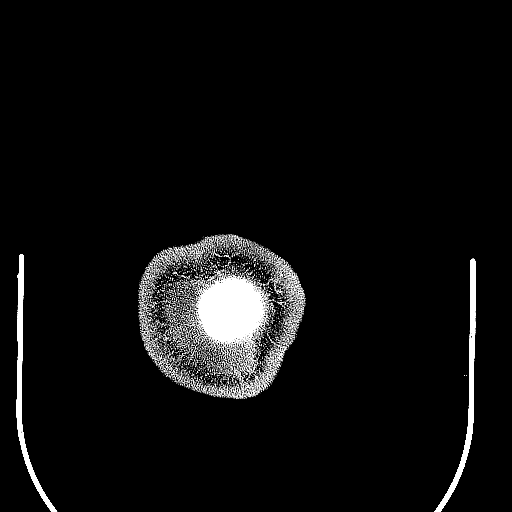

[15 of 30 positions shown; findings below may reference images not displayed]

FINDINGS: Status post right frontal and temporal craniotomy. Two masses are
again noted laterally in the right temporal lobe with surrounding
white matter edema. There appears to be a small amount of hemorrhage
around these lesions which may be slightly increased compared to
prior exam and consistent with postoperative hemorrhage. 1 cm of
right to left midline shift is noted which is increased compared to
prior exam. [DATE] x 2.5 cm mass is noted more medially in right
cerebral hemisphere with surrounding white matter edema. Ventricular
size is within normal limits.
IMPRESSION: Increased right to left midline shift is noted most likely due to
worsening white matter edema due to intraparenchymal masses as
described above. There appears to be a slightly increased amount of
postoperative hemorrhage seen inferiorly in the right temporal lobe.
Critical Value/emergent results were called by telephone at the time
of interpretation on 02/23/2015 at [DATE] to Darcy Trotta, the
patient's nurse, who verbally acknowledged these results and will
contact the physician.

## 2017-06-11 IMAGING — DX DG CHEST 1V PORT
1 series · 2 of 2 positions shown · non-contrast
Comparison: 02/23/2015

CLINICAL DATA: Shortness of breath, intubated

EXAM:
PORTABLE CHEST - 1 VIEW

[Series 1: chest ap · 0.14mm/px · 2 of 2 slices shown]
[im 1/2]
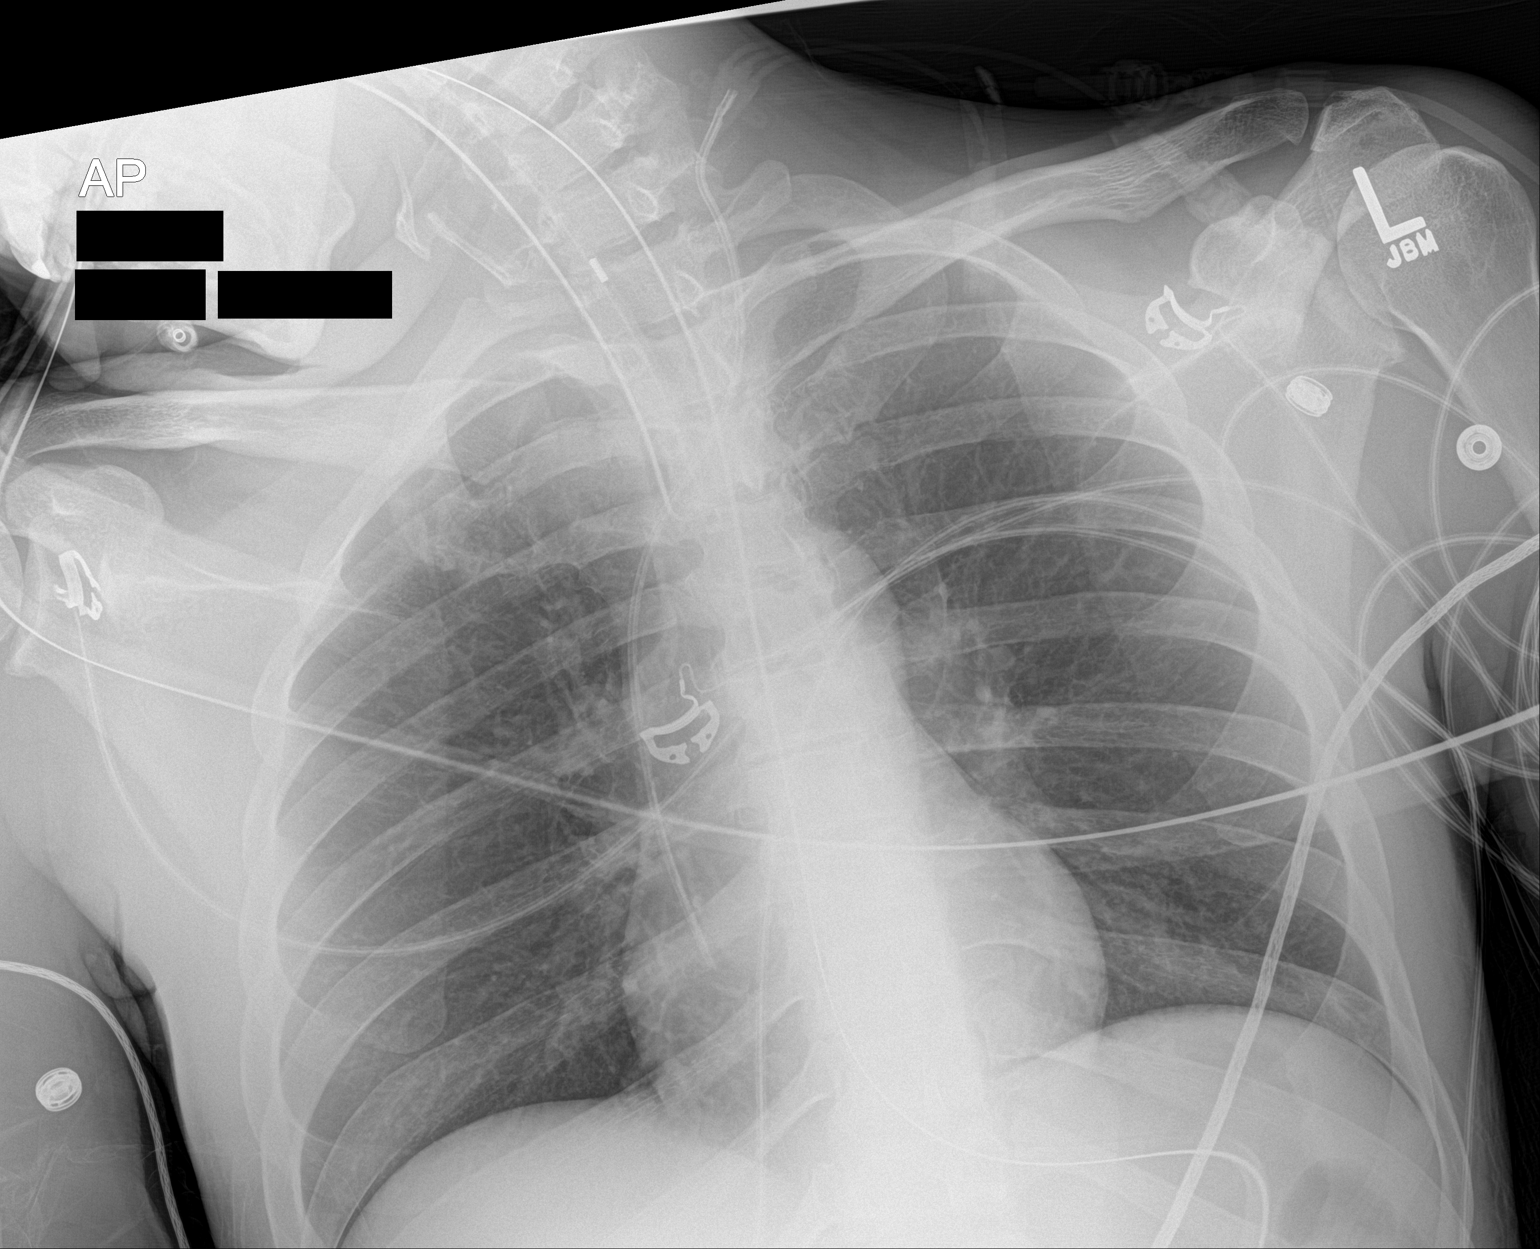
[im 2/2]
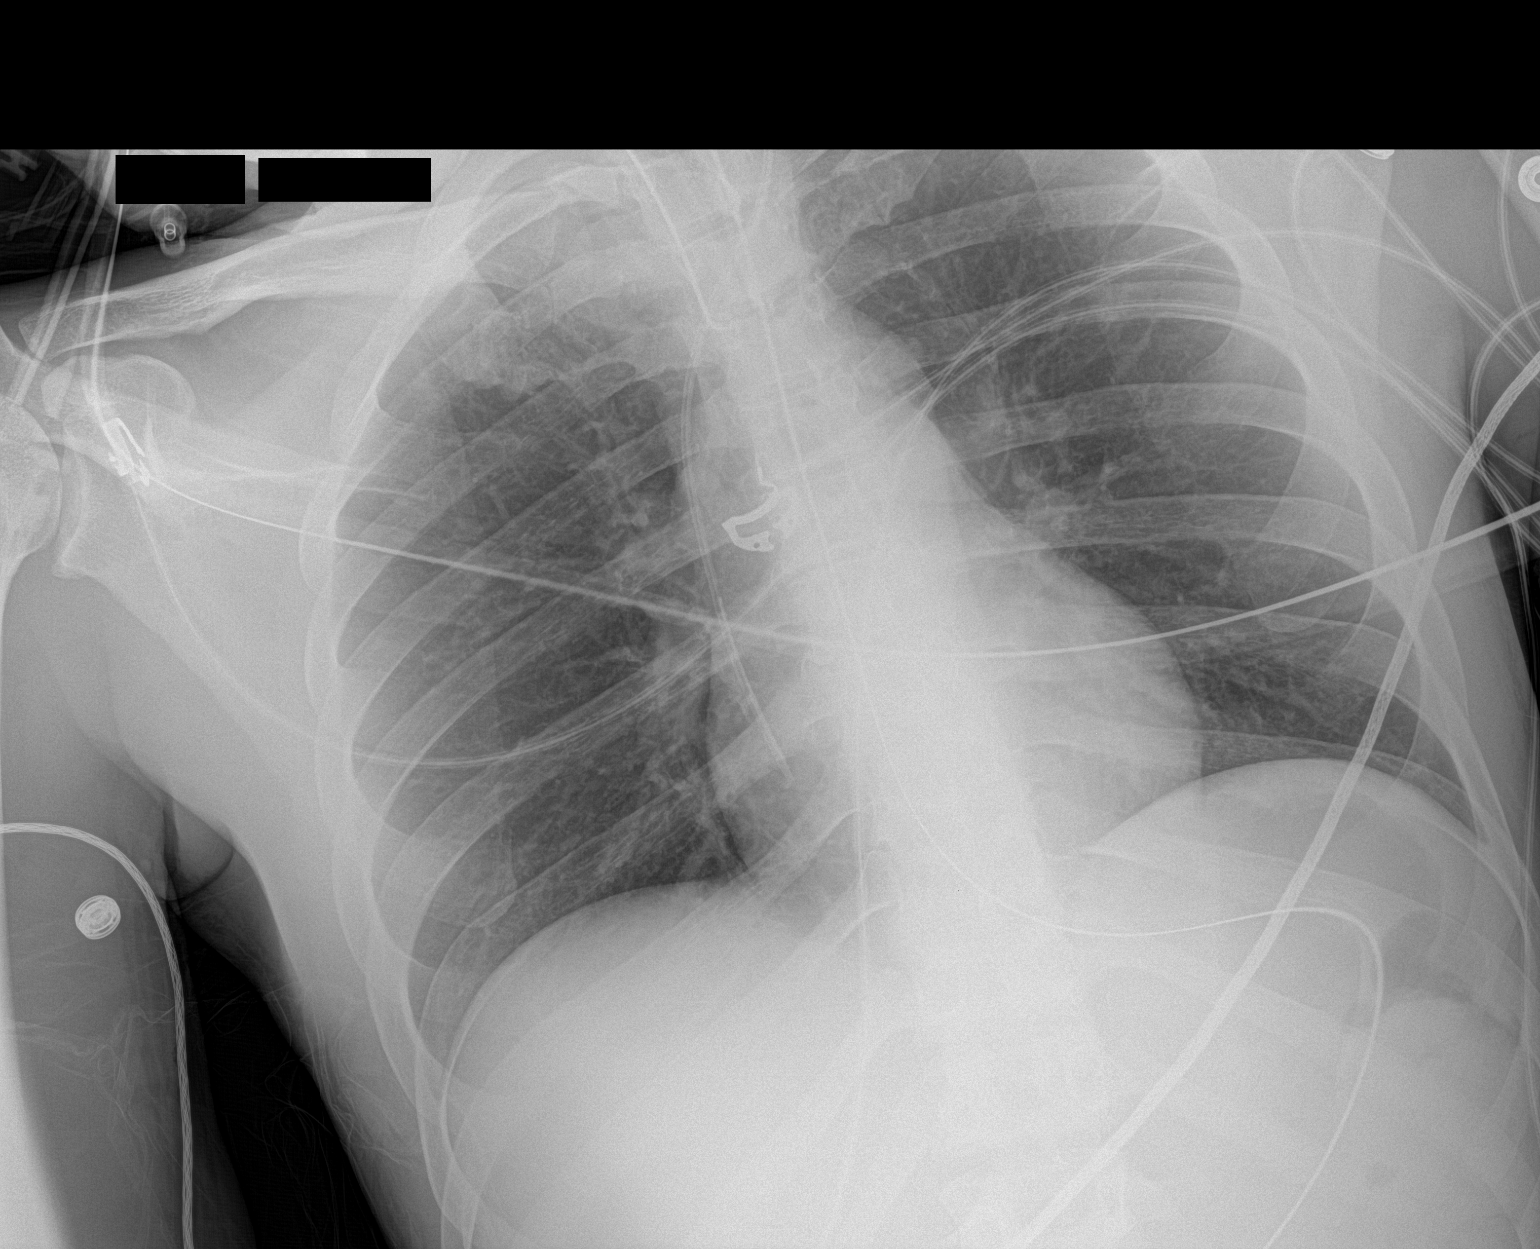

[2 of 2 positions shown; findings below may reference images not displayed]

FINDINGS: Cardiomediastinal silhouette is stable. Stable endotracheal and NG
tube position. There is left IJ central line with tip in right
atrium. No acute infiltrate or pulmonary edema. No pneumothorax.
IMPRESSION: Stable support apparatus.  No active disease.  No pneumothorax.
